# Patient Record
Sex: Female | Born: 1935
Health system: Southern US, Community
[De-identification: ages and names within clinical notes are randomized; demographics above are authoritative.]

## PROBLEM LIST (undated history)

## (undated) DIAGNOSIS — C189 Malignant neoplasm of colon, unspecified: Secondary | ICD-10-CM

## (undated) DIAGNOSIS — R011 Cardiac murmur, unspecified: Secondary | ICD-10-CM

## (undated) DIAGNOSIS — G250 Essential tremor: Secondary | ICD-10-CM

## (undated) DIAGNOSIS — I350 Nonrheumatic aortic (valve) stenosis: Secondary | ICD-10-CM

## (undated) DIAGNOSIS — J45909 Unspecified asthma, uncomplicated: Secondary | ICD-10-CM

## (undated) DIAGNOSIS — F329 Major depressive disorder, single episode, unspecified: Secondary | ICD-10-CM

## (undated) DIAGNOSIS — I10 Essential (primary) hypertension: Secondary | ICD-10-CM

## (undated) HISTORY — DX: Nonrheumatic aortic (valve) stenosis: I35.0

## (undated) HISTORY — DX: Malignant neoplasm of colon, unspecified: C18.9

## (undated) HISTORY — PX: COLOSTOMY: SHX63

## (undated) HISTORY — DX: Cardiac murmur, unspecified: R01.1

## (undated) HISTORY — DX: Essential tremor: G25.0

## (undated) HISTORY — DX: Essential (primary) hypertension: I10

## (undated) HISTORY — DX: Major depressive disorder, single episode, unspecified: F32.9

---

## 2006-05-24 ENCOUNTER — Ambulatory Visit: Payer: Self-pay | Admitting: Family Medicine

## 2006-06-24 ENCOUNTER — Ambulatory Visit: Payer: Self-pay | Admitting: Family Medicine

## 2006-08-13 ENCOUNTER — Ambulatory Visit: Payer: Self-pay | Admitting: Family Medicine

## 2006-08-26 ENCOUNTER — Ambulatory Visit: Payer: Self-pay | Admitting: Family Medicine

## 2006-10-14 ENCOUNTER — Ambulatory Visit: Payer: Self-pay | Admitting: Family Medicine

## 2006-10-16 ENCOUNTER — Ambulatory Visit: Payer: Self-pay | Admitting: Family Medicine

## 2006-11-07 ENCOUNTER — Ambulatory Visit: Payer: Self-pay | Admitting: Family Medicine

## 2007-01-20 ENCOUNTER — Ambulatory Visit: Payer: Self-pay | Admitting: Family Medicine

## 2007-07-01 ENCOUNTER — Ambulatory Visit: Payer: Self-pay | Admitting: Family Medicine

## 2007-07-01 DIAGNOSIS — I10 Essential (primary) hypertension: Secondary | ICD-10-CM | POA: Insufficient documentation

## 2007-07-01 DIAGNOSIS — J449 Chronic obstructive pulmonary disease, unspecified: Secondary | ICD-10-CM

## 2007-07-01 DIAGNOSIS — F411 Generalized anxiety disorder: Secondary | ICD-10-CM

## 2007-09-04 ENCOUNTER — Ambulatory Visit: Payer: Self-pay | Admitting: Family Medicine

## 2007-11-18 ENCOUNTER — Ambulatory Visit: Payer: Self-pay | Admitting: Family Medicine

## 2008-02-05 ENCOUNTER — Ambulatory Visit: Payer: Self-pay | Admitting: Family Medicine

## 2008-02-06 ENCOUNTER — Encounter: Payer: Self-pay | Admitting: Family Medicine

## 2008-02-09 ENCOUNTER — Encounter: Payer: Self-pay | Admitting: Family Medicine

## 2008-02-09 ENCOUNTER — Telehealth: Payer: Self-pay | Admitting: Family Medicine

## 2008-02-09 LAB — CONVERTED CEMR LAB
ALT: 9 units/L (ref 0–35)
AST: 14 units/L (ref 0–37)
Albumin: 4.3 g/dL (ref 3.5–5.2)
Alkaline Phosphatase: 77 units/L (ref 39–117)
Cholesterol, target level: 200 mg/dL
HDL goal, serum: 40 mg/dL
LDL Cholesterol: 112 mg/dL — ABNORMAL HIGH (ref 0–99)
LDL Goal: 130 mg/dL
Potassium: 5.6 meq/L — ABNORMAL HIGH (ref 3.5–5.3)
Sodium: 139 meq/L (ref 135–145)
TSH: 6.734 microintl units/mL — ABNORMAL HIGH (ref 0.350–5.50)
Total Protein: 7.1 g/dL (ref 6.0–8.3)

## 2008-02-20 ENCOUNTER — Encounter: Payer: Self-pay | Admitting: Family Medicine

## 2008-02-23 LAB — CONVERTED CEMR LAB
Albumin: 4.2 g/dL (ref 3.5–5.2)
Alkaline Phosphatase: 78 units/L (ref 39–117)
BUN: 14 mg/dL (ref 6–23)
Creatinine, Ser: 0.78 mg/dL (ref 0.40–1.20)
Glucose, Bld: 124 mg/dL — ABNORMAL HIGH (ref 70–99)
Potassium: 5 meq/L (ref 3.5–5.3)
Total Bilirubin: 0.5 mg/dL (ref 0.3–1.2)

## 2008-04-05 ENCOUNTER — Ambulatory Visit: Payer: Self-pay | Admitting: Family Medicine

## 2008-04-05 DIAGNOSIS — E348 Other specified endocrine disorders: Secondary | ICD-10-CM | POA: Insufficient documentation

## 2008-04-19 ENCOUNTER — Ambulatory Visit: Payer: Self-pay | Admitting: Family Medicine

## 2008-04-19 DIAGNOSIS — R946 Abnormal results of thyroid function studies: Secondary | ICD-10-CM | POA: Insufficient documentation

## 2008-04-26 ENCOUNTER — Encounter: Payer: Self-pay | Admitting: Family Medicine

## 2008-04-27 DIAGNOSIS — E119 Type 2 diabetes mellitus without complications: Secondary | ICD-10-CM

## 2008-04-27 LAB — CONVERTED CEMR LAB
BUN: 18 mg/dL (ref 6–23)
Chloride: 103 meq/L (ref 96–112)
Potassium: 4.6 meq/L (ref 3.5–5.3)

## 2008-05-27 ENCOUNTER — Ambulatory Visit: Payer: Self-pay | Admitting: Family Medicine

## 2008-08-19 ENCOUNTER — Ambulatory Visit: Payer: Self-pay | Admitting: Family Medicine

## 2009-04-20 ENCOUNTER — Ambulatory Visit: Payer: Self-pay | Admitting: Family Medicine

## 2009-04-20 DIAGNOSIS — M76899 Other specified enthesopathies of unspecified lower limb, excluding foot: Secondary | ICD-10-CM

## 2009-12-06 ENCOUNTER — Telehealth: Payer: Self-pay | Admitting: Family Medicine

## 2010-07-13 ENCOUNTER — Ambulatory Visit: Payer: Self-pay | Admitting: Family

## 2010-07-13 DIAGNOSIS — K921 Melena: Secondary | ICD-10-CM

## 2010-07-13 LAB — CONVERTED CEMR LAB
BUN: 14 mg/dL (ref 6–23)
Basophils Relative: 0 % (ref 0–1)
Calcium: 9.9 mg/dL (ref 8.4–10.5)
Creatinine, Ser: 0.9 mg/dL (ref 0.40–1.20)
Eosinophils Absolute: 0.1 10*3/uL (ref 0.0–0.7)
MCHC: 31.3 g/dL (ref 30.0–36.0)
MCV: 100.8 fL — ABNORMAL HIGH (ref 78.0–100.0)
Monocytes Absolute: 0.7 10*3/uL (ref 0.1–1.0)
Monocytes Relative: 8 % (ref 3–12)
Neutrophils Relative %: 65 % (ref 43–77)
Potassium: 5.3 meq/L (ref 3.5–5.3)
RBC: 3.96 M/uL (ref 3.87–5.11)

## 2010-07-16 ENCOUNTER — Telehealth: Payer: Self-pay | Admitting: Family

## 2010-07-21 ENCOUNTER — Encounter: Payer: Self-pay | Admitting: Family Medicine

## 2010-08-04 ENCOUNTER — Encounter: Payer: Self-pay | Admitting: Family Medicine

## 2010-08-08 ENCOUNTER — Encounter: Payer: Self-pay | Admitting: Family Medicine

## 2010-08-08 ENCOUNTER — Telehealth: Payer: Self-pay | Admitting: Family Medicine

## 2010-08-08 DIAGNOSIS — C189 Malignant neoplasm of colon, unspecified: Secondary | ICD-10-CM | POA: Insufficient documentation

## 2010-08-17 ENCOUNTER — Encounter: Payer: Self-pay | Admitting: Family Medicine

## 2010-09-13 ENCOUNTER — Encounter: Payer: Self-pay | Admitting: Family Medicine

## 2010-09-14 ENCOUNTER — Encounter: Payer: Self-pay | Admitting: Family Medicine

## 2010-09-25 ENCOUNTER — Encounter: Payer: Self-pay | Admitting: Family Medicine

## 2011-01-30 NOTE — Progress Notes (Signed)
  Phone Note Outgoing Call   Summary of Call: Please call patient and let her know that I reviewed her lab work and it is suggestive of a possible B12 deficiency.  I would like for her to return for a B12 and folate level.  Orders in EMR. Initial call taken by: Kathlene November,  July 17, 2010 8:24 AM  Follow-up for Phone Call        informed pt of results and Nandika Stetzer's instructions. Faxed order to lab- pt will go on Friday Follow-up by: Kathlene November,  July 17, 2010 8:25 AM

## 2011-01-30 NOTE — Assessment & Plan Note (Signed)
Summary: Med refill - jr   Vital Signs:  Patient profile:   77 year old female Height:      64 inches Weight:      160 pounds BMI:     27.56 Pulse rate:   93 / minute BP sitting:   143 / 66  (left arm) Cuff size:   regular  Vitals Entered By: Kathlene November (July 13, 2010 3:21 PM) CC: bleeding from rectum- has past history of hemorroids   Primary Care Provider:  Linford Arnold, C  CC:  bleeding from rectum- has past history of hemorroids.  History of Present Illness: Meredith Wilson is an 77 year old female who presents with 2-3 week history of bleeding from the rectum.   She notes that she was using a "salve" which helped initially.  She has noted bright red blood in the toilet about 2-3 times total.  Notes + rectal pain "outside" which she attributes to her hemorrhoids.  She reports that she has never had a colonoscopy.  Current Medications (verified): 1)  Advair Hfa 115-21 Mcg/act  Aero (Fluticasone-Salmeterol) .... 2 Puffs Inhaled Two Times A Day 2)  Alprazolam 0.5 Mg Tabs (Alprazolam) .... Take One Tablet By Mouth Three Times A Day 3)  Proair Hfa  Aers (Albuterol Sulfate Aers) .... Use One Puff When Going Outside As Needed 4)  Lisinopril-Hydrochlorothiazide 10-12.5 Mg  Tabs (Lisinopril-Hydrochlorothiazide) .... Take 1 Tablet By Mouth Once A Day  Allergies (verified): 1)  ! Pcn 2)  ! * Eggs  Comments:  Nurse/Medical Assistant: The patient's medications and allergies were reviewed with the patient and were updated in the Medication and Allergy Lists. Kathlene November (July 13, 2010 3:22 PM)  Physical Exam  General:  Well-developed,well-nourished,in no acute distress; alert,appropriate and cooperative throughout examination Abdomen:  Bowel sounds positive,abdomen soft and non-tender without masses, organomegaly or hernias noted. Rectal:  Heme +, rectal exam with some external hemorrhoids noted, no internal abnormalities appreciated.    Impression & Recommendations:  Problem # 1:   HEMATOCHEZIA (ICD-578.1) Check CBC to rule out anemia. heme positive today, ? internal hemorrhoids.    Will refer to GI.  Orders: T-CBC w/Diff ([id number]) Gastroenterology Referral (GI)  Complete Medication List: 1)  Advair Hfa 115-21 Mcg/act Aero (Fluticasone-salmeterol) .... 2 puffs inhaled two times a day 2)  Alprazolam 0.5 Mg Tabs (Alprazolam) .... Take one tablet by mouth three times a day 3)  Proair Hfa Aers (Albuterol sulfate aers) .... Use one puff when going outside as needed 4)  Lisinopril-hydrochlorothiazide 10-12.5 Mg Tabs (Lisinopril-hydrochlorothiazide) .... Take 1 tablet by mouth once a day  Other Orders: T-Basic Metabolic Panel [id number]) Administration Flu vaccine - MCR [phone])  Patient Instructions: 1)  Please complete your blood work downstairs today. 2)  You will be contacted about your referral to GI. 3)  Call if you develop recurrent rectal bleeding, go to the ER if severe.    Prescriptions: ADVAIR HFA 115-21 MCG/ACT  AERO (FLUTICASONE-SALMETEROL) 2 puffs inhaled two times a day  #2 x 0   Entered and Authorized by:   Lemont Fillers FNP   Signed by:   Lemont Fillers FNP on 07/13/2010   Method used:   Samples Given   RxID:   [id number] ALPRAZOLAM 0.5 MG TABS (ALPRAZOLAM) take one tablet by mouth three times a day  #90 x 0   Entered and Authorized by:   Lemont Fillers FNP   Signed by:   Lemont Fillers FNP on 07/13/2010  Method used:   Print then Give to Patient   RxID:   [id number] LISINOPRIL-HYDROCHLOROTHIAZIDE 10-12.5 MG  TABS (LISINOPRIL-HYDROCHLOROTHIAZIDE) Take 1 tablet by mouth once a day  #30 Tablet x 2   Entered and Authorized by:   Lemont Fillers FNP   Signed by:   Lemont Fillers FNP on 07/13/2010   Method used:   Electronically to        [facility] [phone]* (retail)       [address]       [place], [place]  [address]       Ph: [phone] or [phone]       Fax: [phone]   RxID:    [id number]

## 2011-01-30 NOTE — Letter (Signed)
Summary: [facility]   Imported By: Lanelle Bal 09/14/2010 11:35:23  _____________________________________________________________________  External Attachment:    Type:   Image     Comment:   External Document

## 2011-01-30 NOTE — Consult Note (Signed)
Summary: [facility]   Imported By: Lanelle Bal 08/15/2010 11:50:50  _____________________________________________________________________  External Attachment:    Type:   Image     Comment:   External Document

## 2011-01-30 NOTE — Consult Note (Signed)
Summary: [facility]  [facility]   Imported By: Lanelle Bal 07/31/2010 11:38:33  _____________________________________________________________________  External Attachment:    Type:   Image     Comment:   External Document

## 2011-01-30 NOTE — Letter (Signed)
Summary: [facility]   Imported By: Lanelle Bal 10/18/2010 09:14:00  _____________________________________________________________________  External Attachment:    Type:   Image     Comment:   External Document

## 2011-01-30 NOTE — Op Note (Signed)
Summary: Rectal Cancer Surgery/Medical [facility]  Rectal Cancer Surgery/Medical [facility]   Imported By: Lanelle Bal 10/02/2010 14:24:23  _____________________________________________________________________  External Attachment:    Type:   Image     Comment:   External Document

## 2011-01-30 NOTE — Op Note (Signed)
Summary: Abdominoperineal Resection/Medical [facility]  Abdominoperineal Resection/Medical [facility]   Imported By: Lanelle Bal 09/26/2010 12:02:22  _____________________________________________________________________  External Attachment:    Type:   Image     Comment:   External Document

## 2011-01-30 NOTE — Letter (Signed)
Summary: Letter to Patient with CT & Lab Results/[facility]  Letter to Patient with CT & Lab Results/[facility]   Imported By: Lanelle Bal 08/22/2010 10:09:31  _____________________________________________________________________  External Attachment:    Type:   Image     Comment:   External Document

## 2011-01-30 NOTE — Progress Notes (Signed)
Summary: Pt dx with colon Ca  Phone Note Outgoing Call   Summary of Call: Called pt to check on her. Dx iwth colon Ca and saw Dr. Jason Fila last week to discuss. She is awaiting  further workup and she is feeling tired today. Told her she is in our thoughts and to call if needs anything.  Initial call taken by: Nani Gasser MD,  August 08, 2010 11:35 AM  New Problems: ADENOCARCINOMA, COLON (ICD-153.9)   New Problems: ADENOCARCINOMA, COLON (ICD-153.9)

## 2011-04-09 ENCOUNTER — Other Ambulatory Visit: Payer: Self-pay | Admitting: Family Medicine

## 2011-04-09 MED ORDER — ALPRAZOLAM 0.5 MG PO TABS: 0.5000 mg | ORAL_TABLET | Freq: Three times a day (TID) | ORAL | Status: AC | PRN

## 2011-05-01 ENCOUNTER — Other Ambulatory Visit: Payer: Self-pay | Admitting: *Deleted

## 2011-05-01 ENCOUNTER — Other Ambulatory Visit: Payer: Self-pay | Admitting: Family

## 2011-05-01 MED ORDER — LISINOPRIL-HYDROCHLOROTHIAZIDE 10-12.5 MG PO TABS: 1.0000 | ORAL_TABLET | Freq: Every day | ORAL | Status: DC

## 2011-05-01 NOTE — Telephone Encounter (Signed)
Received fax request for refill on Lisinopril-hctz 10-12.5mg   1 tablet daily. Last filled 03/30/11. Pt has no future appointments on file. She was last seen 07/13/10. Please advise if ok to give refill?

## 2011-06-21 ENCOUNTER — Other Ambulatory Visit: Payer: Self-pay | Admitting: Family Medicine

## 2011-06-21 MED ORDER — ALPRAZOLAM 0.5 MG PO TABS: 0.5000 mg | ORAL_TABLET | Freq: Three times a day (TID) | ORAL | Status: AC | PRN

## 2011-06-28 ENCOUNTER — Telehealth: Payer: Self-pay | Admitting: Family Medicine

## 2011-06-28 NOTE — Telephone Encounter (Signed)
A call was received for request of advair.  Don't see it on the med list in either system.  Called the patient and patient and the daughter state Dr. Linford Arnold has been giving samples of this medication and she has never had a script that went through the pharm.  Can you please advise if we can give a new script for this medicine that she takes for her asthma?? Jarvis Newcomer, LPN [facility]

## 2011-06-29 ENCOUNTER — Telehealth: Payer: Self-pay | Admitting: Family Medicine

## 2011-06-29 MED ORDER — FLUTICASONE-SALMETEROL 115-21 MCG/ACT IN AERO: 2.0000 | INHALATION_SPRAY | Freq: Two times a day (BID) | RESPIRATORY_TRACT | Status: DC

## 2011-06-29 NOTE — Telephone Encounter (Signed)
Pt contacted, but N/A after several rings to let her know that the prescription had been sent to her pharm and she could pick it up.   Jarvis Newcomer, LPN [facility]

## 2011-06-29 NOTE — Telephone Encounter (Signed)
I found her old dose and RFd it for her today.  She is overdue for f/u visit with Dr Linford Arnold.  Pls try to set this up in the next 3 wks.

## 2011-07-02 NOTE — Telephone Encounter (Signed)
Closed

## 2011-07-04 ENCOUNTER — Other Ambulatory Visit: Payer: Self-pay | Admitting: Family Medicine

## 2011-08-29 ENCOUNTER — Other Ambulatory Visit: Payer: Self-pay | Admitting: Family Medicine

## 2011-08-29 ENCOUNTER — Encounter: Payer: Self-pay | Admitting: Family Medicine

## 2011-08-29 NOTE — Telephone Encounter (Signed)
Daughter called and said that her mother needed refill of her xanex, but back in June 2012 Dr. Cathey Endow reviewed request for MDI and said then that pt needed to follow up within 3 weeks.  Pt did not come for appt.   Plan:  Pt daughter informed will need to schedule office visit for multi problems(DM, anxiety, thyroid, HTN).  Allotted 30 min appt since multi problems.  Scheduled for 09-04-11. Jarvis Newcomer, LPN [facility]

## 2011-08-30 ENCOUNTER — Encounter: Payer: Self-pay | Admitting: Family Medicine

## 2011-08-31 ENCOUNTER — Other Ambulatory Visit: Payer: Self-pay | Admitting: *Deleted

## 2011-08-31 MED ORDER — ALPRAZOLAM 0.5 MG PO TABS: 0.5000 mg | ORAL_TABLET | Freq: Three times a day (TID) | ORAL | Status: DC | PRN

## 2011-09-01 ENCOUNTER — Other Ambulatory Visit: Payer: Self-pay | Admitting: Family Medicine

## 2011-09-04 ENCOUNTER — Encounter: Payer: Self-pay | Admitting: Family Medicine

## 2011-09-04 ENCOUNTER — Telehealth: Payer: Self-pay | Admitting: Family Medicine

## 2011-09-04 ENCOUNTER — Ambulatory Visit (INDEPENDENT_AMBULATORY_CARE_PROVIDER_SITE_OTHER): Payer: 59 | Admitting: Family Medicine

## 2011-09-04 DIAGNOSIS — F329 Major depressive disorder, single episode, unspecified: Secondary | ICD-10-CM | POA: Insufficient documentation

## 2011-09-04 DIAGNOSIS — C189 Malignant neoplasm of colon, unspecified: Secondary | ICD-10-CM

## 2011-09-04 DIAGNOSIS — F32A Depression, unspecified: Secondary | ICD-10-CM

## 2011-09-04 DIAGNOSIS — Z85038 Personal history of other malignant neoplasm of large intestine: Secondary | ICD-10-CM | POA: Insufficient documentation

## 2011-09-04 HISTORY — DX: Malignant neoplasm of colon, unspecified: C18.9

## 2011-09-04 HISTORY — DX: Depression, unspecified: F32.A

## 2011-09-04 MED ORDER — FLUOXETINE HCL 20 MG PO CAPS: 20.0000 mg | ORAL_CAPSULE | Freq: Every day | ORAL | Status: DC

## 2011-09-04 MED ORDER — ALPRAZOLAM 0.5 MG PO TABS: 0.5000 mg | ORAL_TABLET | Freq: Three times a day (TID) | ORAL | Status: DC | PRN

## 2011-09-04 NOTE — Progress Notes (Signed)
  Subjective:    Patient ID: Meredith Wilson, female    DOB: [birth year], 77 y.o.   MRN: [id number]  HPI She was dx with colon cancer. She now has a bag and she was told she is not a candidate for chemo/raditatikon.  See. Dr. Owens Loffler and Dr. Toni Arthurs at [facility].  [phone].  She is really struggling financially and with her medical bills.  She had labs done about 3 weeks ago which she reports were normal. I personally haven't gotten a copy of any of the records from her recent hospitalization and surgery..   Had an asthma attack recently. She says since then she's been doing well. She does have a history of chronic bronchitis. She said she just started having a coughing fit and feel like she can breathe but it finally, resolved. She's not had any recent infections or fevers. No shortness of breath. She is here today because she says she is really struggling with her mood. She's been very careful and has felt very down since her diagnosis of cancer. Unfortunately she has not candidate for radiation or chemotherapy. Her family is supportive of her. She does currently take Xanax but wants to do something else that she could use. She does not want to increase the dose on her Xanax.  Review of Systems     Objective:   Physical Exam  Constitutional: She is oriented to person, place, and time. She appears well-developed and well-nourished.  HENT:  Head: Normocephalic and atraumatic.  Neck: No thyromegaly present.  Cardiovascular: Normal rate, regular rhythm and normal heart sounds.   Pulmonary/Chest: Effort normal and breath sounds normal.  Lymphadenopathy:    She has no cervical adenopathy.  Neurological: She is alert and oriented to person, place, and time.  Skin: Skin is warm and dry.  Psychiatric: She has a normal mood and affect. Her behavior is normal.          Assessment & Plan:  Anxiety/depression - GAD-8 is 20 today. We discussed different options. I recommend starting an  SSRI such as fluoxetine. I did discuss the potential side effects that typically resolve in the first 1-2 weeks. She will followup in 3-4 weeks. I also refilled her Xanax as well. Fortunately she does have a supportive family. She would also be a good candidate for counseling she is dealing with her cancer.  Episode of shortness of breath. It is unclear to me if this was truly asthma or if she was having more of a panic attack. Certainly she feels well today and her lung exam is normal.  She declined the flu vaccine today.

## 2011-09-04 NOTE — Telephone Encounter (Signed)
Please call and get records about patient's recent colon cancer.Dr. Owens Loffler and Dr. Toni Arthurs at [facility]. [phone].

## 2011-09-06 NOTE — Telephone Encounter (Signed)
Called and requested records be faxed to [place]. KJ LPN

## 2011-09-25 ENCOUNTER — Encounter: Payer: Self-pay | Admitting: Family Medicine

## 2011-10-02 ENCOUNTER — Ambulatory Visit (INDEPENDENT_AMBULATORY_CARE_PROVIDER_SITE_OTHER): Payer: 59 | Admitting: Family Medicine

## 2011-10-02 ENCOUNTER — Encounter: Payer: Self-pay | Admitting: Family Medicine

## 2011-10-02 VITALS — BP 151/73 | HR 80 | Wt 169.0 lb

## 2011-10-02 DIAGNOSIS — M7060 Trochanteric bursitis, unspecified hip: Secondary | ICD-10-CM

## 2011-10-02 DIAGNOSIS — F411 Generalized anxiety disorder: Secondary | ICD-10-CM

## 2011-10-02 DIAGNOSIS — M76899 Other specified enthesopathies of unspecified lower limb, excluding foot: Secondary | ICD-10-CM

## 2011-10-02 DIAGNOSIS — I10 Essential (primary) hypertension: Secondary | ICD-10-CM

## 2011-10-02 DIAGNOSIS — F419 Anxiety disorder, unspecified: Secondary | ICD-10-CM

## 2011-10-02 MED ORDER — FLUOXETINE HCL 40 MG PO CAPS: 40.0000 mg | ORAL_CAPSULE | Freq: Every day | ORAL | Status: DC

## 2011-10-02 NOTE — Progress Notes (Signed)
  Subjective:    Patient ID: Meredith Wilson, female    DOB: 1923/03/17, 77 y.o.   MRN: [id number]  HPI Left hip pain for about 5 days ago.  Now pianful to walk. Painful to stand from sitting position. Has has problems with it on and off.  Last injection was 1.5 ago for trochanteric bursitis and says it helped her up until recently. No other alleviating symptoms. She has not been using anything for pain relief.  Mood - tolerateing fluoxetine well w/ no SE. Feels it has helped her some. She denies any side effects on the medication. She would like to increase her dose.  Hypertension-she says she is very consistent with her medications and takes them regularly. Though she did not take them this morning like she usually does. No chest pain or shortness of breath. Review of Systems     Objective:   Physical Exam  Constitutional: She is oriented to person, place, and time. She appears well-developed and well-nourished.  HENT:  Head: Normocephalic and atraumatic.  Cardiovascular: Normal rate, regular rhythm and normal heart sounds.   Pulmonary/Chest: Effort normal and breath sounds normal.  Musculoskeletal:       Tender over the left greater trochanteric bursa. Hip with NROM. Painful with walking.   Neurological: She is alert and oriented to person, place, and time.       + tremor, essential  Skin: Skin is warm and dry.  Psychiatric: She has a normal mood and affect.          Assessment & Plan:  Anxiety - GAD-7 score of 16 (down from score of 20). PHQ- 9 score of 7. Will bump dose dose on the fluoexetine.  Recheck in 6 weeks.   Lt trochanteric bursitis - Dsicussed options. She wanted to go ahead with injection today.   HTN - didn't take her med today. Reminded her to take med and will recheck in 6 weeks.    Trochanteric bursa injection-there was cleaned with iodine and alcohol. 9-cc of 1% lidocaine without epi and 40 mg of Kenalog were injected into the left great greater trochanteric  bursa. Patient tolerated the procedure well and had 0 blood loss.

## 2011-11-04 ENCOUNTER — Other Ambulatory Visit: Payer: Self-pay | Admitting: Family Medicine

## 2011-11-13 ENCOUNTER — Ambulatory Visit: Payer: 59 | Admitting: Family Medicine

## 2011-12-26 ENCOUNTER — Other Ambulatory Visit: Payer: Self-pay | Admitting: *Deleted

## 2011-12-26 MED ORDER — ALPRAZOLAM 0.5 MG PO TABS: 0.5000 mg | ORAL_TABLET | Freq: Three times a day (TID) | ORAL | Status: DC | PRN

## 2012-01-28 ENCOUNTER — Other Ambulatory Visit: Payer: Self-pay | Admitting: Family Medicine

## 2012-02-28 ENCOUNTER — Other Ambulatory Visit: Payer: Self-pay | Admitting: *Deleted

## 2012-02-28 MED ORDER — LISINOPRIL-HYDROCHLOROTHIAZIDE 10-12.5 MG PO TABS: 1.0000 | ORAL_TABLET | Freq: Every day | ORAL | Status: DC

## 2012-03-05 ENCOUNTER — Other Ambulatory Visit: Payer: Self-pay | Admitting: *Deleted

## 2012-03-05 MED ORDER — ALPRAZOLAM 0.5 MG PO TABS
0.5000 mg | ORAL_TABLET | Freq: Three times a day (TID) | ORAL | Status: DC | PRN
Start: 2012-03-05 — End: 2012-07-28

## 2012-03-31 ENCOUNTER — Other Ambulatory Visit: Payer: Self-pay | Admitting: Family Medicine

## 2012-05-09 ENCOUNTER — Other Ambulatory Visit: Payer: Self-pay | Admitting: Family Medicine

## 2012-06-13 ENCOUNTER — Ambulatory Visit
Admission: RE | Admit: 2012-06-13 | Discharge: 2012-06-13 | Disposition: A | Discharge status: Home / Self Care | Payer: 59 | Source: Ambulatory Visit | Attending: Family Medicine | Admitting: Family Medicine

## 2012-06-13 ENCOUNTER — Encounter: Payer: Self-pay | Admitting: Family Medicine

## 2012-06-13 ENCOUNTER — Ambulatory Visit (INDEPENDENT_AMBULATORY_CARE_PROVIDER_SITE_OTHER): Payer: 59 | Admitting: Family Medicine

## 2012-06-13 VITALS — BP 123/56 | HR 79 | Temp 98.0°F | Wt 175.0 lb

## 2012-06-13 DIAGNOSIS — R0781 Pleurodynia: Secondary | ICD-10-CM

## 2012-06-13 DIAGNOSIS — H547 Unspecified visual loss: Secondary | ICD-10-CM

## 2012-06-13 DIAGNOSIS — R1011 Right upper quadrant pain: Secondary | ICD-10-CM

## 2012-06-13 DIAGNOSIS — Z9181 History of falling: Secondary | ICD-10-CM

## 2012-06-13 DIAGNOSIS — W19XXXA Unspecified fall, initial encounter: Secondary | ICD-10-CM

## 2012-06-13 DIAGNOSIS — R079 Chest pain, unspecified: Secondary | ICD-10-CM

## 2012-06-13 MED ORDER — HYDROCODONE-ACETAMINOPHEN 5-325 MG PO TABS: 2.0000 | ORAL_TABLET | Freq: Four times a day (QID) | ORAL | Status: DC | PRN

## 2012-06-13 MED ORDER — ONDANSETRON 4 MG PO TBDP: 4.0000 mg | ORAL_TABLET | Freq: Three times a day (TID) | ORAL | Status: DC | PRN

## 2012-06-13 MED ORDER — LORATADINE 10 MG PO TABS: 10.0000 mg | ORAL_TABLET | Freq: Every day | ORAL | Status: DC

## 2012-06-13 NOTE — Patient Instructions (Addendum)
We will call you with your lab results. If you don't here from us in about a week then please give us a call at [phone].  

## 2012-06-13 NOTE — Progress Notes (Signed)
  Subjective:    Patient ID: Meredith Wilson, female    DOB: [birth year], 78 y.o.   MRN: [id number]  HPI Says doesn' see well and tried to see down on a chair and missed and landed on her bottom and hit her ribs on the chair.  Has been using Aleve but not really helping. No SOB. Radiates to fron of ribs.  Daugher will be staying with her.   RUQ pain for about 6 months. Now her belly feels swollen.  Feels nauseated all the time. Pain is worse after she eating.  Hx of Gb removal.  Last saw oncology 6 months ago.  No changein bowels. Most of the time feels constipated.    Review of Systems     Objective:   Physical Exam  Constitutional: She is oriented to person, place, and time. She appears well-developed and well-nourished.  HENT:  Head: Normocephalic and atraumatic.  Cardiovascular: Normal rate, regular rhythm and normal heart sounds.   Pulmonary/Chest: Effort normal and breath sounds normal.  Abdominal: Soft. Bowel sounds are normal. She exhibits no distension and no mass. There is tenderness. There is no rebound and no guarding.       Very tender in the RUQ.  + rebound.   Musculoskeletal:       She is also tender over the right lateral lower ribs. No bruising or contusion or swelling.  Neurological: She is alert and oriented to person, place, and time.  Skin: Skin is warm and dry.  Psychiatric: She has a normal mood and affect. Her behavior is normal.          Assessment & Plan:  Dec vision - Needs to get eye appint. Has been years since last check-up.  She did have her cataract in one eye treated but never had the other one fixed. She says she has been under the grocery store because she can't see the items on the shelves anymore.  High fall risk - work on getting vision exam.  She doesn't use any assisstive devices.   RUQ pain - May be liver vs scar tissue for recurrence of colon cancer.  She has followup with her oncologist in 2 weeks. It is her six-month checkup. We will go  ahead and at least check liver enzymes and a CBC today. This is concerning to me. If her appointment gets pushed back with her oncologist her to call me immediately so that we can move forward with a CT scan or at least an ultrasound at a minimum. I also sent a prescription for Zofran for her nausea. Her daughter is here with her today and agrees. She does have a history of colon cancer with a colostomy bag.  Rib pain - Will get rib films to evaluate for fracture. Given a prescription for hydrocodone. I did let her know that this could increase her risk of falls per daughter says that she will stay with her and make sure that she is safe. I did warn about constipation with narcotics and encouraged her to take a stool softener with the medication.

## 2012-06-14 LAB — COMPLETE METABOLIC PANEL WITH GFR
Albumin: 4.3 g/dL (ref 3.5–5.2)
BUN: 15 mg/dL (ref 6–23)
CO2: 24 mEq/L (ref 19–32)
GFR, Est African American: 64 mL/min
GFR, Est Non African American: 55 mL/min — ABNORMAL LOW
Glucose, Bld: 126 mg/dL — ABNORMAL HIGH (ref 70–99)
Sodium: 129 mEq/L — ABNORMAL LOW (ref 135–145)
Total Bilirubin: 0.5 mg/dL (ref 0.3–1.2)
Total Protein: 6.9 g/dL (ref 6.0–8.3)

## 2012-06-14 LAB — CBC WITH DIFFERENTIAL/PLATELET

## 2012-06-16 ENCOUNTER — Telehealth: Payer: Self-pay | Admitting: *Deleted

## 2012-06-16 DIAGNOSIS — R1011 Right upper quadrant pain: Secondary | ICD-10-CM

## 2012-06-16 NOTE — Telephone Encounter (Signed)
Lab order

## 2012-06-23 ENCOUNTER — Other Ambulatory Visit: Payer: Self-pay | Admitting: *Deleted

## 2012-06-23 MED ORDER — HYDROCODONE-ACETAMINOPHEN 5-325 MG PO TABS: 2.0000 | ORAL_TABLET | Freq: Four times a day (QID) | ORAL | Status: DC | PRN

## 2012-06-23 NOTE — Telephone Encounter (Signed)
I will refill once. Dr. Linford Arnold will manage next refill. Only take as needed for pain.

## 2012-06-23 NOTE — Telephone Encounter (Signed)
Pt's daughter is calling requesting a refill on her mother's norco. Please advise if we can refill. She got 20 on 06/13/12.

## 2012-06-24 ENCOUNTER — Other Ambulatory Visit: Payer: Self-pay | Admitting: *Deleted

## 2012-06-24 ENCOUNTER — Other Ambulatory Visit: Payer: Self-pay | Admitting: Physician Assistant

## 2012-06-24 MED ORDER — HYDROCODONE-ACETAMINOPHEN 5-325 MG PO TABS: 2.0000 | ORAL_TABLET | Freq: Four times a day (QID) | ORAL | Status: AC | PRN

## 2012-06-24 MED ORDER — HYDROCODONE-ACETAMINOPHEN 5-325 MG PO TABS: 2.0000 | ORAL_TABLET | Freq: Four times a day (QID) | ORAL | Status: DC | PRN

## 2012-06-24 NOTE — Telephone Encounter (Signed)
Called and unable to LM

## 2012-06-24 NOTE — Telephone Encounter (Signed)
[facility] to [facility]

## 2012-07-28 ENCOUNTER — Other Ambulatory Visit: Payer: Self-pay | Admitting: Family Medicine

## 2012-07-28 ENCOUNTER — Other Ambulatory Visit: Payer: Self-pay | Admitting: *Deleted

## 2012-07-28 MED ORDER — ALPRAZOLAM 0.5 MG PO TABS: 0.5000 mg | ORAL_TABLET | Freq: Three times a day (TID) | ORAL | Status: DC | PRN

## 2012-09-30 ENCOUNTER — Other Ambulatory Visit: Payer: Self-pay | Admitting: Family Medicine

## 2012-12-01 ENCOUNTER — Other Ambulatory Visit: Payer: Self-pay | Admitting: Family Medicine

## 2012-12-28 ENCOUNTER — Other Ambulatory Visit: Payer: Self-pay | Admitting: Family Medicine

## 2013-02-02 ENCOUNTER — Other Ambulatory Visit: Payer: Self-pay | Admitting: Family Medicine

## 2013-02-02 NOTE — Telephone Encounter (Signed)
Patient hasn't been seen since June 2013

## 2013-02-03 NOTE — Telephone Encounter (Signed)
Ok to fill x 1 but does need a 30 min f/u visit schedule since hasn't been seen in over 6 mo.

## 2013-02-04 ENCOUNTER — Other Ambulatory Visit: Payer: Self-pay

## 2013-02-04 MED ORDER — ALPRAZOLAM 0.5 MG PO TABS: 0.5000 mg | ORAL_TABLET | Freq: Three times a day (TID) | ORAL | Status: DC

## 2013-05-26 ENCOUNTER — Other Ambulatory Visit: Payer: Self-pay | Admitting: Family Medicine

## 2013-05-28 ENCOUNTER — Other Ambulatory Visit: Payer: Self-pay | Admitting: Family Medicine

## 2013-07-28 ENCOUNTER — Other Ambulatory Visit: Payer: Self-pay | Admitting: Family Medicine

## 2013-07-30 ENCOUNTER — Other Ambulatory Visit: Payer: Self-pay | Admitting: Family Medicine

## 2013-08-02 ENCOUNTER — Other Ambulatory Visit: Payer: Self-pay | Admitting: Family Medicine

## 2013-08-03 ENCOUNTER — Telehealth: Payer: Self-pay | Admitting: *Deleted

## 2013-08-03 NOTE — Telephone Encounter (Signed)
Ins sent a fax stating that the zofran will only be covered if pt is actively receiving chemo or radiation & she is not at this time.  Spoke with pt & asked her if she would like [place] to send her something different in & she told me to just forget about the rx altogether.

## 2013-08-06 ENCOUNTER — Encounter: Payer: Self-pay | Admitting: Family Medicine

## 2013-08-06 ENCOUNTER — Ambulatory Visit (INDEPENDENT_AMBULATORY_CARE_PROVIDER_SITE_OTHER): Payer: 59 | Admitting: Family Medicine

## 2013-08-06 VITALS — BP 141/76 | HR 92 | Wt 171.0 lb

## 2013-08-06 DIAGNOSIS — F329 Major depressive disorder, single episode, unspecified: Secondary | ICD-10-CM

## 2013-08-06 DIAGNOSIS — C189 Malignant neoplasm of colon, unspecified: Secondary | ICD-10-CM

## 2013-08-06 DIAGNOSIS — I10 Essential (primary) hypertension: Secondary | ICD-10-CM

## 2013-08-06 DIAGNOSIS — Z131 Encounter for screening for diabetes mellitus: Secondary | ICD-10-CM

## 2013-08-06 DIAGNOSIS — R110 Nausea: Secondary | ICD-10-CM

## 2013-08-06 DIAGNOSIS — E348 Other specified endocrine disorders: Secondary | ICD-10-CM

## 2013-08-06 DIAGNOSIS — F32A Depression, unspecified: Secondary | ICD-10-CM

## 2013-08-06 LAB — POCT GLYCOSYLATED HEMOGLOBIN (HGB A1C): Hemoglobin A1C: 5.1

## 2013-08-06 MED ORDER — LISINOPRIL-HYDROCHLOROTHIAZIDE 10-12.5 MG PO TABS: ORAL_TABLET | ORAL | Status: DC

## 2013-08-06 MED ORDER — ALPRAZOLAM 0.5 MG PO TABS: 0.5000 mg | ORAL_TABLET | Freq: Three times a day (TID) | ORAL | Status: DC

## 2013-08-06 MED ORDER — PROMETHAZINE HCL 12.5 MG PO TABS: 12.5000 mg | ORAL_TABLET | Freq: Four times a day (QID) | ORAL | Status: DC | PRN

## 2013-08-06 MED ORDER — CITALOPRAM HYDROBROMIDE 20 MG PO TABS: 20.0000 mg | ORAL_TABLET | Freq: Every day | ORAL | Status: DC

## 2013-08-06 NOTE — Progress Notes (Signed)
  Subjective:    Patient ID: Meredith Wilson, female    DOB: October 31, 1923, 79 y.o.   MRN: [id number]  HPI Depression - she has been really teaful lately. Her daughter is here with her.  She didn't tolerate the prozac well. Says she didn't feel good on it but couldn't tell me any specific S.e.  She is out of her xanax.  Ran out 2 days ago.  Her daughter does well on celexa.    He is having catarct surgery next week.  She had the right done a few weeks ago and that has helped her mood. Her vision was getting so bad she would just burst into tears bc she couldn't see well.   Still struggling with colon cancer but she is stable so far. Her insurance denied her zofran.  She is down 4 lbs.  Has had colostoy bag x 3 yr.   Chronic bronchitis-she's been out of inhaler. She's been using her daughter's occasionally. She has had a couple episodes of feeling short of breath the last month. No acute sxs today. No cough or change in sputum production.   HTN-  Pt denies chest pain, SOB, dizziness, or heart palpitations.  Taking meds as directed w/o problems.  Denies medication side effects.     Review of Systems     Objective:   Physical Exam  Constitutional: She is oriented to person, place, and time. She appears well-developed and well-nourished.  HENT:  Head: Normocephalic and atraumatic.  Cardiovascular: Normal rate, regular rhythm and normal heart sounds.   Pulmonary/Chest: Effort normal and breath sounds normal.  Musculoskeletal: She exhibits no edema.  Neurological: She is alert and oriented to person, place, and time.  Skin: Skin is warm and dry.  Psychiatric: She has a normal mood and affect. Her behavior is normal.          Assessment & Plan:  Depression  - we discussed the option of starting citalopram. Since it is working well for her daughter it may work well for her. She is a thin to trying it. We'll start medication and have her f/u in 6 weeks.   Colon cancer - for her nausea we  will try Phenergan for Zofran is not covered by her insurance. I told her to call me if she feels like it's not working well. She usually gets nauseated around bedtime and takes it then.  Chronic bronchitis - Given sample of Advair.  I know medicaiton is expesive. Can change if something is covered beter on her formulary.   HTN- Well controlled on current regime.   IFG - A1C in the normal range. Will downgrade to normal and removed dx from her problem list.

## 2013-09-29 ENCOUNTER — Other Ambulatory Visit: Payer: Self-pay | Admitting: Family Medicine

## 2013-10-05 ENCOUNTER — Other Ambulatory Visit: Payer: Self-pay | Admitting: Family Medicine

## 2013-11-05 ENCOUNTER — Ambulatory Visit (INDEPENDENT_AMBULATORY_CARE_PROVIDER_SITE_OTHER): Payer: 59 | Admitting: Family Medicine

## 2013-11-30 ENCOUNTER — Other Ambulatory Visit: Payer: Self-pay | Admitting: Family Medicine

## 2013-12-08 ENCOUNTER — Other Ambulatory Visit: Payer: Self-pay | Admitting: *Deleted

## 2013-12-08 MED ORDER — ALPRAZOLAM 0.5 MG PO TABS: ORAL_TABLET | ORAL | Status: DC

## 2014-01-11 DIAGNOSIS — J449 Chronic obstructive pulmonary disease, unspecified: Secondary | ICD-10-CM | POA: Insufficient documentation

## 2014-01-14 ENCOUNTER — Other Ambulatory Visit: Payer: Self-pay | Admitting: Family Medicine

## 2014-01-14 LAB — COMPLETE METABOLIC PANEL WITH GFR
ALT: 23 U/L (ref 7–35)
AST: 33 U/L
Albumin, Serum(Neph): 3.2
Albumin/Glob SerPl: 1.2
Alkaline Phosphatase: 94 U/L
Anion gap: 7
BUN/Creatinine Ratio: 14.5
BUN: 12 mg/dL (ref 4–21)
CO2: 23 mmol/L
Calcium: 8.3 mg/dL
Chloride: 96 mmol/L
Creat: 0.83
GFR calc Af Amer: >60
GFR calc non Af Amer: >60
Globulin: 2.6
Glucose: 109
Potassium: 4.6 mmol/L
Sodium: 126 mmol/L — AB (ref 137–147)
Total Bilirubin: 1.3 mg/dL
Total Protein: 5.8 g/dL

## 2014-01-14 LAB — TSH: TSH: 5.72

## 2014-01-14 LAB — VITAMIN B12: Blood: 8.3

## 2014-01-26 ENCOUNTER — Telehealth: Payer: Self-pay | Admitting: *Deleted

## 2014-01-26 NOTE — Telephone Encounter (Signed)
Pt's daughter informed that her appt is @ 1045 AM tomorrow. She also wanted to know if the copay from her last visit will be applied being that she had to go to the hospital the same day she came in? I told her that I think so however I'm not for sure.Audelia Hives [place]

## 2014-01-27 ENCOUNTER — Telehealth: Payer: Self-pay | Admitting: Family Medicine

## 2014-01-27 ENCOUNTER — Ambulatory Visit (INDEPENDENT_AMBULATORY_CARE_PROVIDER_SITE_OTHER): Payer: 59 | Admitting: Family Medicine

## 2014-01-27 ENCOUNTER — Encounter: Payer: Self-pay | Admitting: Family Medicine

## 2014-01-27 VITALS — BP 120/62 | HR 94 | Wt 162.0 lb

## 2014-01-27 DIAGNOSIS — E871 Hypo-osmolality and hyponatremia: Secondary | ICD-10-CM

## 2014-01-27 DIAGNOSIS — S93401A Sprain of unspecified ligament of right ankle, initial encounter: Secondary | ICD-10-CM

## 2014-01-27 DIAGNOSIS — Z780 Asymptomatic menopausal state: Secondary | ICD-10-CM

## 2014-01-27 DIAGNOSIS — S12500A Unspecified displaced fracture of sixth cervical vertebra, initial encounter for closed fracture: Secondary | ICD-10-CM

## 2014-01-27 DIAGNOSIS — S93409A Sprain of unspecified ligament of unspecified ankle, initial encounter: Secondary | ICD-10-CM

## 2014-01-27 DIAGNOSIS — I359 Nonrheumatic aortic valve disorder, unspecified: Secondary | ICD-10-CM

## 2014-01-27 DIAGNOSIS — Z9181 History of falling: Secondary | ICD-10-CM

## 2014-01-27 DIAGNOSIS — I350 Nonrheumatic aortic (valve) stenosis: Secondary | ICD-10-CM

## 2014-01-27 HISTORY — DX: Nonrheumatic aortic (valve) stenosis: I35.0

## 2014-01-27 MED ORDER — HYDROCODONE-ACETAMINOPHEN 5-325 MG PO TABS: 1.0000 | ORAL_TABLET | ORAL | Status: DC | PRN

## 2014-01-27 MED ORDER — ALPRAZOLAM 0.5 MG PO TABS: ORAL_TABLET | ORAL | Status: DC

## 2014-01-27 NOTE — Progress Notes (Signed)
Subjective:    Patient ID: Meredith Wilson, female    DOB: 12-30-1923, 80 y.o.   MRN: [id number]  HPI She fell backwards about 3 weeks ago. Went to [facility] She had LOC.  Her sodium was low at 120.  She fell twice that day.  Fracture so her C6.  No fracture to ankle,  but did hurt her right ankle.  She is wearing her soft support brace for her neck and has a hard brace at home as well. Given an air-cast for her ankle but says didn't fit well so has been using a wrap support that she feels is comfortable.  Says the swelling in her ankle is much improved compared to 3 weeks ago. Has been able to walk on it som.  They stopped her diuretic and celexa bc thought could have contributed to her fall. Was d/c home yesterday.  Will start getting home PT tomorrow.  She lost weight while there. Has lost 9 lbs since august. She is taking Norco 5/325 every 4 hours for pain right now.    Review of Systems     Objective:   Physical Exam  Constitutional: She is oriented to person, place, and time. She appears well-developed and well-nourished.  HENT:  Head: Normocephalic and atraumatic.  Cardiovascular: Normal rate, regular rhythm and normal heart sounds.   Pulmonary/Chest: Effort normal and breath sounds normal.  Neurological: She is alert and oriented to person, place, and time.  Skin: Skin is warm and dry.  Psychiatric: She has a normal mood and affect. Her behavior is normal.          Assessment & Plan:  Falls - She is doing better. Will start PT this week through home physical therapy. Expect to get her orders for sometime this week to complete. I do think she has gained a lot of strength in doing rehabilitation at a skilled nursing facility. I'm hoping that she will get back to her baseline. Also hopefully getting her sodium levels back under control getting her off of the medications that may have been causing the issue will hopefully R. fall risk as well. We'll also request that home  health company to home evaluation to help her reduce fall risk in the home..   Neck pain - C6 fracture-continue to wear her soft collar for support. She started wearing it for 3 weeks so far. She says one provider told her to wear for 3 weeks another provider to told her to wear for 6-8 weeks. I will review her hospital records and get back with her as far as the specific time frame that she needs to wear the support. Her neck pain has been improving. The she would like a refill on the Norco today. Will wean norco as tolerated.  Given rx today.    Right ankle sprain - still has some swelling around the lateral ankle. A little bit of bruising but appears to be healing well. She has noted increased laxity with anterior-posterior drawer. Normal strength in all directions. Good capillary refill of the toes and dorsal pedal pulses 2+. She does have a wrap support with her today it appears to be in appropriate position. For now I think continuing the support is helpful. I asked her she felt like it was too loose we can always do an ASO lace up brace but she felt like this was enough support in her ankle was continuing to improve. We'll check on this again at her followup in  3-4 weeks.  Hyponatremia-check BMP today. We'll call the results. Hopefully the offending agents have been stopped.

## 2014-01-27 NOTE — Telephone Encounter (Addendum)
Continue to wear soft collar for at least one more week. Try get her in with our partner Dr. Aundria Mems to reevaluate her C6 fracture and make sure that it's healing properly. We will schedule that appointment for her next week. Tonya weekly schedule her. Also make sure that she's taking adequate calcium and vitamin D daily. Also see if she remembers when her last bone density test was. I did not see one in the system.

## 2014-01-27 NOTE — Telephone Encounter (Signed)
Pt's daughter informed appt made for 2.5.15 @215  W/Dr.T. Also told to make sure that she is taking calcium and vit D daily. She does not recall how long ago its been since her bone density.Meredith Wilson, Lahoma Crocker

## 2014-01-28 LAB — BASIC METABOLIC PANEL WITH GFR
BUN: 11 mg/dL (ref 6–23)
CHLORIDE: 102 meq/L (ref 96–112)
CO2: 28 meq/L (ref 19–32)
CREATININE: 0.82 mg/dL (ref 0.50–1.10)
Calcium: 9.5 mg/dL (ref 8.4–10.5)
GFR, EST NON AFRICAN AMERICAN: 64 mL/min
GFR, Est African American: 74 mL/min
Glucose, Bld: 107 mg/dL — ABNORMAL HIGH (ref 70–99)
POTASSIUM: 5.4 meq/L — AB (ref 3.5–5.3)
Sodium: 135 mEq/L (ref 135–145)

## 2014-01-28 NOTE — Telephone Encounter (Signed)
Order for DEXA scan placed. 

## 2014-02-01 ENCOUNTER — Telehealth: Payer: Self-pay | Admitting: *Deleted

## 2014-02-01 NOTE — Telephone Encounter (Signed)
Bonnita Nasuti called and stated that pt does not wish to have a bone dx test done and she was wanting to make Dr.Metheney aware of this.Audelia Hives [place]

## 2014-02-04 ENCOUNTER — Ambulatory Visit: Payer: 59 | Admitting: Sports Medicine

## 2014-02-11 ENCOUNTER — Telehealth: Payer: Self-pay | Admitting: *Deleted

## 2014-02-11 MED ORDER — BUSPIRONE HCL 5 MG PO TABS: 5.0000 mg | ORAL_TABLET | Freq: Three times a day (TID) | ORAL | Status: DC

## 2014-02-11 NOTE — Telephone Encounter (Signed)
Spoke with pharmacy manager & he states that the xanax was filled & dispensed on 2/9 & no identification was asked for.  He states that they could review the video footage of who signed for the med but that would take forever to do.  I then called the pt.  She states to me that her husband did NOT go to the pharm on 2/9 because she didn't have the money to pick it up at that time, she had the money yesterday & that's why he went yesterday to pick it up but it was not there.

## 2014-02-11 NOTE — Telephone Encounter (Signed)
Noted patient with hx multiple falls, anxiety, and rx for controlleds substance that seems to be "lost." I do not feel comfortable rxing this medication as I do think there are better alternatives for chronic/generalized anxiety. It seems she has declined SSRIs in the past and is not on any controller medication at all. Calling in short course of BuSpar now, patient can then f/u with PCP or me re: further anxiety, certainly would recommend SSRI.

## 2014-02-11 NOTE — Telephone Encounter (Signed)
See other phone note for patient and rx disposition.

## 2014-02-11 NOTE — Telephone Encounter (Signed)
Registered nurse Meredith Wilson from [facility] that takes care of Meredith Wilson calls today stating that someone else other than the pt & her husband was sold her xanax rx [facility]. Pt's husband is the ONLY person who ever gets her meds for her, she is unable to do so on her own.  Meredith Wilson went to the pt's house yesterday & pt was in tears & was shaking tremendously.  Pt takes xanax TID for tremors & hasn't had it for the last 2 days.  Meredith Wilson went to the pharm & questioned why no one was asked for an ID & they told her they don't check IDs for xanax.  Would you be willing to send 1 months supply for the pt or even the whole amount.  Pt is going to switch all her meds to [facility] due to this situation.

## 2014-02-12 NOTE — Telephone Encounter (Signed)
Pt notified of new rx.

## 2014-02-22 ENCOUNTER — Other Ambulatory Visit: Payer: Self-pay | Admitting: Family Medicine

## 2014-02-24 ENCOUNTER — Telehealth: Payer: Self-pay | Admitting: *Deleted

## 2014-02-24 NOTE — Telephone Encounter (Signed)
Nurse w/[facility] called and reported that [facility] pulled the video [facility] tapes and reported that pt's daughter was the person who signed for her meds. She stated that pt told her that she (the daughter) is addicted to crack cocaine and that pt has since told the daughter that she cannot come back into the home.  She told the family that she felt like it would be in their best interest to change pharmacies and they are considering [facility] being that they deliver to the home however she is not certain if this is their final decision

## 2014-03-02 ENCOUNTER — Ambulatory Visit: Payer: 59 | Admitting: Family Medicine

## 2014-03-03 ENCOUNTER — Other Ambulatory Visit: Payer: Self-pay | Admitting: Family Medicine

## 2014-03-03 MED ORDER — HYDROCODONE-ACETAMINOPHEN 5-325 MG PO TABS: 1.0000 | ORAL_TABLET | ORAL | Status: DC | PRN

## 2014-03-11 ENCOUNTER — Encounter: Payer: Self-pay | Admitting: Family Medicine

## 2014-03-11 ENCOUNTER — Ambulatory Visit (INDEPENDENT_AMBULATORY_CARE_PROVIDER_SITE_OTHER): Payer: 59 | Admitting: Family Medicine

## 2014-03-11 VITALS — BP 148/70 | HR 89 | Resp 14 | Wt 161.0 lb

## 2014-03-11 DIAGNOSIS — F3289 Other specified depressive episodes: Secondary | ICD-10-CM

## 2014-03-11 DIAGNOSIS — C189 Malignant neoplasm of colon, unspecified: Secondary | ICD-10-CM

## 2014-03-11 DIAGNOSIS — F329 Major depressive disorder, single episode, unspecified: Secondary | ICD-10-CM

## 2014-03-11 DIAGNOSIS — F32A Depression, unspecified: Secondary | ICD-10-CM

## 2014-03-11 DIAGNOSIS — M542 Cervicalgia: Secondary | ICD-10-CM

## 2014-03-11 DIAGNOSIS — E875 Hyperkalemia: Secondary | ICD-10-CM

## 2014-03-11 MED ORDER — ALBUTEROL SULFATE HFA 108 (90 BASE) MCG/ACT IN AERS: 1.0000 | INHALATION_SPRAY | Freq: Four times a day (QID) | RESPIRATORY_TRACT | Status: DC | PRN

## 2014-03-11 MED ORDER — ALPRAZOLAM 0.5 MG PO TABS: ORAL_TABLET | ORAL | Status: DC

## 2014-03-11 NOTE — Progress Notes (Signed)
   Subjective:    Patient ID: Meredith Wilson, female    DOB: [birth year], 80 y.o.   MRN: [id number]  HPI F/U fall.  She is doing better overall.  She has been cooking and has been doing some light housework.  Right ankle is much better.  Occassional swelling.  Neck is some better. She oc gets stiff and tight.  Not using heat or ice. She says she got a refill for the hydrocodone for her husband to pharmacy 3 times to pick it up and they did not have her ready. She is getting some physical therapy. Additionally has one more visit left. She has found it very helpful.  Asthma - has noticed some intermittant wheezing with the weather change. Out of albuterol. Needs a new prescription.  Colon cancer - she's doing well adjusting to having the colostomy bag. Her daughter-in-law helps her with it. She does complain that she gets a little but more full on the left side of her abdomen where the pancreas. She denies any pain or discomfort with it.  Review of Systems     Objective:   Physical Exam  Constitutional: She is oriented to person, place, and time. She appears well-developed and well-nourished.  HENT:  Head: Normocephalic and atraumatic.  Right Ear: External ear normal.  Left Ear: External ear normal.  Nose: Nose normal.     Eyes: Conjunctivae and EOM are normal. Pupils are equal, round, and reactive to light.  Neck: Neck supple. No thyromegaly present.  Cardiovascular: Normal rate, regular rhythm and normal heart sounds.   Pulmonary/Chest: Effort normal and breath sounds normal. She has no wheezes.  Lymphadenopathy:    She has no cervical adenopathy.  Neurological: She is alert and oriented to person, place, and time.  Skin: Skin is warm and dry.  Psychiatric: She has a normal mood and affect.          Assessment & Plan:  Colon cancer - her daughter-in-law is helping her with her colostomy bag and this has been doing a little bit better. She still gets a lot of fullness on the left  side of her abdomen with a bag is. It bothers her somewhat but isn't necessarily painful.  Neck pain-improved but not resolved. She still getting up from a spasm and stiffness. Work on gentle stretching and add heat to her regimen. If it's still not helpful we could consider a muscle relaxer but warned about potential for sedation with this type of medication. I did refill her hydrocodone on the fourth. Unfortunately she did not realize that she had to come pick up the hard copy. Thus we provided that today. She's him is completed physical therapy. I encouraged her to keep up the exercises that they have given her and reviewed with her on her own.  Anxiety-did refill her alprazolam today. Also encouraged to set aside on a refill to make it easier for her.  Asthma-she's been having acute exacerbations. She is out of her albuterol. New prescription sent to pharmacy today. Lungs are clear today.   Elevated potassium level-recheck today.

## 2014-03-12 LAB — BASIC METABOLIC PANEL WITH GFR
BUN: 13 mg/dL (ref 6–23)
CHLORIDE: 104 meq/L (ref 96–112)
CO2: 25 meq/L (ref 19–32)
CREATININE: 0.7 mg/dL (ref 0.50–1.10)
Calcium: 9.2 mg/dL (ref 8.4–10.5)
GFR, Est African American: 89 mL/min
GFR, Est Non African American: 78 mL/min
GLUCOSE: 106 mg/dL — AB (ref 70–99)
Potassium: 4 mEq/L (ref 3.5–5.3)
Sodium: 138 mEq/L (ref 135–145)

## 2014-03-30 ENCOUNTER — Ambulatory Visit (INDEPENDENT_AMBULATORY_CARE_PROVIDER_SITE_OTHER): Payer: 59 | Admitting: Family Medicine

## 2014-03-30 ENCOUNTER — Encounter: Payer: Self-pay | Admitting: Family Medicine

## 2014-03-30 VITALS — BP 176/71 | HR 88 | Temp 97.7°F | Wt 157.0 lb

## 2014-03-30 DIAGNOSIS — J45901 Unspecified asthma with (acute) exacerbation: Secondary | ICD-10-CM

## 2014-03-30 MED ORDER — METHYLPREDNISOLONE SODIUM SUCC 125 MG IJ SOLR
125.0000 mg | Freq: Once | INTRAMUSCULAR | Status: AC
  Administered 2014-03-30: 125 mg via INTRAMUSCULAR

## 2014-03-30 MED ORDER — PREDNISONE 20 MG PO TABS: ORAL_TABLET | ORAL | Status: AC

## 2014-03-30 MED ORDER — IPRATROPIUM-ALBUTEROL 0.5-2.5 (3) MG/3ML IN SOLN
3.0000 mL | RESPIRATORY_TRACT | Status: DC
  Administered 2014-03-30: 3 mL via RESPIRATORY_TRACT

## 2014-03-30 MED ORDER — AZITHROMYCIN 250 MG PO TABS: ORAL_TABLET | ORAL | Status: AC

## 2014-03-30 MED ORDER — IPRATROPIUM-ALBUTEROL 0.5-2.5 (3) MG/3ML IN SOLN
3.0000 mL | RESPIRATORY_TRACT | Status: DC
Start: 2014-03-30 — End: 2014-03-30

## 2014-03-30 NOTE — Progress Notes (Signed)
CC: Meredith Wilson is a 80 y.o. female is here for Asthma   Subjective: HPI:  Complains of shortness of breath and wheezing that has been present for the past week worsening on a daily basis. Seems to be worse in the evenings especially at night. Slight improvement with albuterol user by nebulizer or inhaler.  Continues on Advair twice a day without missed doses. Symptoms are moderate to severe in severity. Denies confusion, fevers, chills, chest pain, back pain, aspiration, choking, nor dizziness  Review Of Systems Outlined In HPI  Past Medical History  Diagnosis Date  . Hypertension     No past surgical history on file. Family History  Problem Relation Age of Onset  . Heart disease Brother     History   Social History  . Marital Status: Widowed    Spouse Name: N/A    Number of Children: N/A  . Years of Education: N/A   Occupational History  . Not on file.   Social History Main Topics  . Smoking status: Never Smoker   . Smokeless tobacco: Not on file  . Alcohol Use: Yes  . Drug Use: No  . Sexual Activity: No     Comment: 1 cffeine per day, walks for exercise, lives with daughter and son-n-law, uses a walker at home.   Other Topics Concern  . Not on file   Social History Narrative  . No narrative on file     Objective: BP 176/71  Pulse 88  Temp(Src) 97.7 F (36.5 C) (Oral)  Wt 157 lb (71.215 kg)  SpO2 95%  General: Alert and Oriented, No Acute Distress HEENT: Pupils equal, round, reactive to light. Conjunctivae clear.  External ears unremarkable, canals clear with intact TMs with appropriate landmarks.  Middle ear appears open without effusion. Pink inferior turbinates.  Moist mucous membranes, pharynx without inflammation nor lesions.  Neck supple without palpable lymphadenopathy nor abnormal masses. Lungs: Comfortable work of breathing moderate end expiratory wheezing in all lung fields slightly improved with nebulizer treatment, before and after there were no  rales/rhonchi/nor signs of consolidation. Cardiac: Regular rate and rhythm. Normal S1/S2.  No murmurs, rubs, nor gallops.   Extremities: No peripheral edema.  Strong peripheral pulses.  Mental Status: No depression, anxiety, nor agitation. Skin: Warm and dry.  Assessment & Plan: Kennie was seen today for asthma.  Diagnoses and associated orders for this visit:  Asthma with acute exacerbation - azithromycin (ZITHROMAX) 250 MG tablet; Take two tabs at once on day 1, then one tab daily on days 2-5. - predniSONE (DELTASONE) 20 MG tablet; Three tabs daily days 1-3, two tabs daily days 4-6, one tab daily days 7-9, half tab daily days 10-13. - methylPREDNISolone sodium succinate (SOLU-MEDROL) 125 mg/2 mL injection 125 mg; Inject 2 mLs (125 mg total) into the muscle once. - Discontinue: ipratropium-albuterol (DUONEB) 0.5-2.5 (3) MG/3ML nebulizer solution 3 mL; Take 3 mLs by nebulization every 4 (four) hours. - ipratropium-albuterol (DUONEB) 0.5-2.5 (3) MG/3ML nebulizer solution 3 mL; Take 3 mLs by nebulization every 4 (four) hours.    Patient reports significant improvement with DuoNeb and feels that the mouth piece of the nebulizer she was using here in clinic was provided better airflow than her piece at home. Scheduled nebulizer treatments at home with albuterol using this new mouth piece every 4 hours for the next 2 days then as needed. Solu-Medrol 125 mg given today along with prednisone taper and azithromycin.Signs and symptoms requring emergent/urgent reevaluation were discussed with the patient.  Return if symptoms worsen or fail to improve.

## 2014-04-01 ENCOUNTER — Ambulatory Visit: Payer: 59 | Admitting: Family Medicine

## 2014-04-06 ENCOUNTER — Telehealth: Payer: Self-pay | Admitting: *Deleted

## 2014-04-06 NOTE — Telephone Encounter (Signed)
Pt called and stated that she did not get her refill of xanax [facility] spoke w/[facility] and she stated that the Rx went thru. Called and informed pt of this.Meredith Wilson

## 2014-04-29 ENCOUNTER — Telehealth: Payer: Self-pay | Admitting: *Deleted

## 2014-04-29 NOTE — Telephone Encounter (Signed)
Pt lvm asking about asthma. Tried to call her and line was busy.Teddy Spike

## 2014-05-03 ENCOUNTER — Ambulatory Visit (INDEPENDENT_AMBULATORY_CARE_PROVIDER_SITE_OTHER): Payer: 59 | Admitting: Family Medicine

## 2014-05-03 ENCOUNTER — Encounter: Payer: Self-pay | Admitting: Family Medicine

## 2014-05-03 VITALS — BP 154/67 | HR 91 | Temp 98.1°F | Wt 145.0 lb

## 2014-05-03 DIAGNOSIS — J45901 Unspecified asthma with (acute) exacerbation: Secondary | ICD-10-CM

## 2014-05-03 MED ORDER — ALBUTEROL SULFATE (2.5 MG/3ML) 0.083% IN NEBU: 2.5000 mg | INHALATION_SOLUTION | Freq: Four times a day (QID) | RESPIRATORY_TRACT | Status: DC | PRN

## 2014-05-03 MED ORDER — MONTELUKAST SODIUM 10 MG PO TABS: 10.0000 mg | ORAL_TABLET | Freq: Every day | ORAL | Status: DC

## 2014-05-03 MED ORDER — METHYLPREDNISOLONE SODIUM SUCC 125 MG IJ SOLR
125.0000 mg | Freq: Once | INTRAMUSCULAR | Status: AC
  Administered 2014-05-03: 125 mg via INTRAMUSCULAR

## 2014-05-03 MED ORDER — PREDNISONE 20 MG PO TABS: ORAL_TABLET | ORAL | Status: AC

## 2014-05-03 NOTE — Addendum Note (Signed)
Addended by: Terance Hart on: 05/03/2014 03:15 PM   Modules accepted: Orders

## 2014-05-03 NOTE — Progress Notes (Signed)
CC: Meredith Wilson is a 80 y.o. female is here for not feeling well   Subjective: HPI:   Accompanied by daughter Meredith Wilson  Patient reports 48 hours of worsening shortness of breath and wheezing with nonproductive cough. Symptoms are typical of her past asthma exacerbations per her report. Symptoms are worse with activity and are resolved fully for about 6 hours after taking albuterol nebulizer treatment. Unfortunate she is out of this medication as of using a treatment just prior to arrival.  Symptoms are worse when pollen counts are high and are worsened by anxiety. He takes Claritin and continues on Advair twice a day. She's using her albuterol 3-4 times a day since symptoms have declined. Overall symptoms are described as moderate in severity at their worst. She denies fevers, chills, productive cough, blood in sputum, chest pain, confusion, nasal congestion, nor facial pain.  Review Of Systems Outlined In HPI  Past Medical History  Diagnosis Date  . Hypertension     No past surgical history on file. Family History  Problem Relation Age of Onset  . Heart disease Brother     History   Social History  . Marital Status: Widowed    Spouse Name: N/A    Number of Children: N/A  . Years of Education: N/A   Occupational History  . Not on file.   Social History Main Topics  . Smoking status: Never Smoker   . Smokeless tobacco: Not on file  . Alcohol Use: Yes  . Drug Use: No  . Sexual Activity: No     Comment: 1 cffeine per day, walks for exercise, lives with daughter and son-n-law, uses a walker at home.   Other Topics Concern  . Not on file   Social History Narrative  . No narrative on file     Objective: BP 154/67  Pulse 91  Temp(Src) 98.1 F (36.7 C) (Oral)  Wt 145 lb (65.772 kg)  General: Alert and Oriented, No Acute Distress HEENT: Pupils equal, round, reactive to light. Conjunctivae clear.  External ears unremarkable, canals clear with intact TMs with appropriate  landmarks.  Middle ear appears open without effusion. Pink inferior turbinates.  Moist mucous membranes, pharynx without inflammation nor lesions.  Neck supple without palpable lymphadenopathy nor abnormal masses. Lungs: Comfortable work of breathing with end expiratory wheezing in all lung fields that is of mild severity. No rhonchi nor rails. Cardiac: Regular rate and rhythm. Normal S1/S2.  No murmurs, rubs, nor gallops.   Extremities: No peripheral edema.  Strong peripheral pulses.  Mental Status: No depression, anxiety, nor agitation. Skin: Warm and dry.  Assessment & Plan: Meredith Wilson was seen today for not feeling well.  Diagnoses and associated orders for this visit:  Asthma with acute exacerbation - albuterol (PROVENTIL) (2.5 MG/3ML) 0.083% nebulizer solution; Take 3 mLs (2.5 mg total) by nebulization every 6 (six) hours as needed for wheezing or shortness of breath. Meredith Wilson may pick up.) - predniSONE (DELTASONE) 20 MG tablet; Three tabs at once daily for five days. Meredith Wilson may pick up) - montelukast (SINGULAIR) 10 MG tablet; Take 1 tablet (10 mg total) by mouth at bedtime.    Asthma exacerbation: Provided refills on albuterol nebulizer solution to be used every 6 hours as needed, consider scheduling this for the next 48 hours then as needed.  Prednisone burst along with Solu-Medrol 125 mg given today. Start Singulair if any lingering symptoms remain.  25 minutes spent face-to-face during visit today of which at least 50%  was counseling or coordinating care regarding: 1. Asthma with acute exacerbation       Return if symptoms worsen or fail to improve.

## 2014-05-27 ENCOUNTER — Ambulatory Visit (INDEPENDENT_AMBULATORY_CARE_PROVIDER_SITE_OTHER): Payer: 59 | Admitting: Family Medicine

## 2014-05-27 ENCOUNTER — Other Ambulatory Visit: Payer: Self-pay | Admitting: Family Medicine

## 2014-05-27 ENCOUNTER — Encounter: Payer: Self-pay | Admitting: Family Medicine

## 2014-05-27 ENCOUNTER — Telehealth: Payer: Self-pay | Admitting: Family Medicine

## 2014-05-27 VITALS — BP 151/73 | HR 97 | Temp 98.1°F

## 2014-05-27 DIAGNOSIS — R197 Diarrhea, unspecified: Secondary | ICD-10-CM

## 2014-05-27 DIAGNOSIS — R112 Nausea with vomiting, unspecified: Secondary | ICD-10-CM

## 2014-05-27 DIAGNOSIS — R300 Dysuria: Secondary | ICD-10-CM

## 2014-05-27 MED ORDER — PROMETHAZINE HCL 25 MG PO TABS: 25.0000 mg | ORAL_TABLET | Freq: Three times a day (TID) | ORAL | Status: DC | PRN

## 2014-05-27 MED ORDER — ONDANSETRON HCL 4 MG PO TABS: 4.0000 mg | ORAL_TABLET | Freq: Three times a day (TID) | ORAL | Status: DC | PRN

## 2014-05-27 NOTE — Telephone Encounter (Signed)
Andrea/Amber, Will you please let patient know that her insurance is causing a hold up with the zofran ([place]) until this goes through i've sent an Rx for promethazine to help with nausea however this will cause some sedation so ultimately I'd like to use zofran if we can get it approved.

## 2014-05-27 NOTE — Telephone Encounter (Signed)
Pt's daughter notified.

## 2014-05-27 NOTE — Progress Notes (Signed)
CC: Meredith Wilson is a 80 y.o. female is here for nausea and vomiting   Subjective: HPI:  Accompanied by daughter-in-law  Patient complains of diarrhea and vomiting that has been present since an acute onset Sunday afternoon. Symptoms were gradually improving without any intervention except for worsening early this morning around 4 AM.  She describes vomiting up to 5 times a day, nonbloody no coffee-ground emesis. Diarrhea is hard to quantify but she notices her colostomy bag has been much more liquid and loose compared to her baseline, she denies any blood or melena appearance in her colostomy bag. Symptoms have been accompanied by abdominal discomfort that is localized in the center of the abdomen, nonradiating, mild in severity, nothing particularly makes better or worse. Review of systems positive for dysuria and decreased appetite since onset of above symptoms. She denies fevers, chills, cough, shortness of breath, back pain, confusion. She continues to have thirst and has been tolerating cranberry juice   Review Of Systems Outlined In HPI  Past Medical History  Diagnosis Date  . Hypertension     No past surgical history on file. Family History  Problem Relation Age of Onset  . Heart disease Brother     History   Social History  . Marital Status: Widowed    Spouse Name: N/A    Number of Children: N/A  . Years of Education: N/A   Occupational History  . Not on file.   Social History Main Topics  . Smoking status: Never Smoker   . Smokeless tobacco: Not on file  . Alcohol Use: Yes  . Drug Use: No  . Sexual Activity: No     Comment: 1 cffeine per day, walks for exercise, lives with daughter and son-n-law, uses a walker at home.   Other Topics Concern  . Not on file   Social History Narrative  . No narrative on file     Objective: BP 151/73  Pulse 97  Temp(Src) 98.1 F (36.7 C) (Oral)  General: Alert and Oriented, No Acute Distress HEENT: Pupils equal, round,  reactive to light. Conjunctivae clear.  Moist mucous membranes parents are remarkable Lungs: Clear to auscultation bilaterally, no wheezing/ronchi/rales.  Comfortable work of breathing. Good air movement. Cardiac: Regular rate and rhythm. Normal S1/S2.  No murmurs, rubs, nor gallops.   Abdomen: Normal bowel sounds, soft and non tender without palpable masses other than mild right upper quadrant pain without rebound or guarding. Colostomy bag without blood or melena, I would describe her stool as loose but not liquid Extremities: No peripheral edema.  Strong peripheral pulses.  Mental Status: No depression, anxiety, nor agitation. Skin: Warm and dry.  Assessment & Plan: Cera was seen today for nausea and vomiting.  Diagnoses and associated orders for this visit:  Nausea with vomiting - Clostridium difficile EIA - COMPLETE METABOLIC PANEL WITH GFR - Lipase - ondansetron (ZOFRAN) 4 MG tablet; Take 1-2 tablets (4-8 mg total) by mouth every 8 (eight) hours as needed for nausea or vomiting.  Diarrhea - Clostridium difficile EIA - COMPLETE METABOLIC PANEL WITH GFR - Lipase  Dysuria - Urinalysis - Urine culture    Nausea and vomiting: Symptomatically control with Zofran, rule out pancreatitis, rule out hepatitis, checking for electrolyte abnormalities, rule out C. Difficile contributing to diarrhea and vomiting. UTI certainly could certainly cause the above symptoms therefore urinalysis and urine culture to be obtained.Signs and symptoms requring emergent/urgent reevaluation were discussed with the patient.  Return if symptoms worsen or fail to  improve.

## 2014-05-28 ENCOUNTER — Telehealth: Payer: Self-pay | Admitting: Family Medicine

## 2014-05-28 ENCOUNTER — Other Ambulatory Visit: Payer: Self-pay | Admitting: Physician Assistant

## 2014-05-28 DIAGNOSIS — A0472 Enterocolitis due to Clostridium difficile, not specified as recurrent: Secondary | ICD-10-CM

## 2014-05-28 DIAGNOSIS — N390 Urinary tract infection, site not specified: Secondary | ICD-10-CM

## 2014-05-28 LAB — URINALYSIS
Glucose, UA: NEGATIVE mg/dL
Hgb urine dipstick: NEGATIVE
KETONES UR: 15 mg/dL — AB
Nitrite: POSITIVE — AB
PROTEIN: NEGATIVE mg/dL
Specific Gravity, Urine: 1.027 (ref 1.005–1.030)
UROBILINOGEN UA: 1 mg/dL (ref 0.0–1.0)
pH: 5 (ref 5.0–8.0)

## 2014-05-28 LAB — COMPLETE METABOLIC PANEL WITH GFR
ALBUMIN: 3.7 g/dL (ref 3.5–5.2)
ALK PHOS: 113 U/L (ref 39–117)
ALT: 23 U/L (ref 0–35)
AST: 25 U/L (ref 0–37)
BILIRUBIN TOTAL: 1 mg/dL (ref 0.2–1.2)
BUN: 18 mg/dL (ref 6–23)
CO2: 25 mEq/L (ref 19–32)
Calcium: 9.3 mg/dL (ref 8.4–10.5)
Chloride: 95 mEq/L — ABNORMAL LOW (ref 96–112)
Creat: 0.84 mg/dL (ref 0.50–1.10)
GFR, EST AFRICAN AMERICAN: 72 mL/min
GFR, Est Non African American: 62 mL/min
Glucose, Bld: 104 mg/dL — ABNORMAL HIGH (ref 70–99)
Potassium: 5.1 mEq/L (ref 3.5–5.3)
Sodium: 133 mEq/L — ABNORMAL LOW (ref 135–145)
TOTAL PROTEIN: 6.5 g/dL (ref 6.0–8.3)

## 2014-05-28 LAB — C. DIFFICILE GDH AND TOXIN A/B
C. difficile GDH: DETECTED — AB
C. difficile Toxin A/B: NOT DETECTED

## 2014-05-28 LAB — LIPASE: Lipase: <10 U/L (ref 0–75)

## 2014-05-28 LAB — CLOSTRIDIUM DIFFICILE BY PCR: Toxigenic C. Difficile by PCR: DETECTED — CR

## 2014-05-28 MED ORDER — METRONIDAZOLE 500 MG PO TABS: 500.0000 mg | ORAL_TABLET | Freq: Three times a day (TID) | ORAL | Status: DC

## 2014-05-28 MED ORDER — CIPROFLOXACIN HCL 250 MG PO TABS: ORAL_TABLET | ORAL | Status: AC

## 2014-05-28 NOTE — Telephone Encounter (Signed)
Seth Bake, Will you please let the patient or her family know that it appears she has both a UTI and a bacterial infection in her bowels.  I've sent two antibiotics to her [facility] which should take care of both.  Ciprofloxacin and metronidazole.  We'll contact her once the urine culture returns.

## 2014-05-28 NOTE — Telephone Encounter (Signed)
Pt.notified

## 2014-05-29 LAB — URINE CULTURE: Colony Count: 35000

## 2014-06-11 ENCOUNTER — Telehealth: Payer: Self-pay

## 2014-06-11 NOTE — Telephone Encounter (Signed)
Home health, [facility], wants to evaluate for the need of OT and PT. I agreed and asked them to send an order.

## 2014-06-17 ENCOUNTER — Ambulatory Visit (INDEPENDENT_AMBULATORY_CARE_PROVIDER_SITE_OTHER): Payer: 59 | Admitting: Family Medicine

## 2014-06-17 ENCOUNTER — Encounter: Payer: Self-pay | Admitting: Family Medicine

## 2014-06-17 VITALS — BP 153/71 | HR 90 | Wt 152.0 lb

## 2014-06-17 DIAGNOSIS — K5732 Diverticulitis of large intestine without perforation or abscess without bleeding: Secondary | ICD-10-CM

## 2014-06-17 DIAGNOSIS — I10 Essential (primary) hypertension: Secondary | ICD-10-CM

## 2014-06-17 DIAGNOSIS — K56609 Unspecified intestinal obstruction, unspecified as to partial versus complete obstruction: Secondary | ICD-10-CM

## 2014-06-17 MED ORDER — AMLODIPINE BESYLATE 5 MG PO TABS: 5.0000 mg | ORAL_TABLET | Freq: Every day | ORAL | Status: DC

## 2014-06-17 MED ORDER — ONDANSETRON HCL 4 MG PO TABS: 4.0000 mg | ORAL_TABLET | Freq: Three times a day (TID) | ORAL | Status: DC | PRN

## 2014-06-17 NOTE — Progress Notes (Signed)
   Subjective:    Patient ID: Meredith Wilson, female    DOB: 08-11-1923, 80 y.o.   MRN: [id number]  HPI Here for hospital folllowup for diverticulitis and small bowel obstruction.  She was admitted here locally at [facility]. She stayed for about 4 days. They gave her IV fluids and IV antibiotics and she improved fairly quickly. They sent her home with 5 days of ciprofloxacin and metronidazole. None of her other medications were changed. Overall she is feeling much better. She does have a colostomy bag status post colon cancer.  Has some nausea twice since has been home. Completed her antibiotic about 4 days ago.   No fever or chills or sweat.s.  She felt like she could be a little bit more active. They have not had any palms with a colostomy bag or the wound site. She did have a CT scan while there confirming the diverticulitis. And possible small bowel obstruction. When she went to the emergency room she was vomiting, having diarrhea and her abdomen was severely bloated. She says that the bloating has actually gone down significantly in the past 2-3 days.  She gets her blood pressure has been more elevated in the 150s since being off of her blood pressure medication. We have stopped it because it had affected her sodium level.  Her daughter is here with her today.  Review of Systems     Objective:   Physical Exam  Constitutional: She is oriented to person, place, and time. She appears well-developed and well-nourished.  HENT:  Head: Normocephalic and atraumatic.  Cardiovascular: Normal rate, regular rhythm and normal heart sounds.   Pulmonary/Chest: Effort normal and breath sounds normal.  Abdominal: Soft. Bowel sounds are normal. She exhibits no distension and no mass. There is no tenderness. There is no rebound and no guarding.  Colostomy bag intact  Neurological: She is alert and oriented to person, place, and time.  Skin: Skin is warm and dry.  Psychiatric: She has a normal  mood and affect. Her behavior is normal.          Assessment & Plan:  Diverticulitis-resolved. She's doing fantastic. Continue with current regimen and monitor for any new symptoms. She's to call me if she notices any change in stools, increase in abdominal pain or bloating, or fevers chills or sweats.  Small bowel obstruction-resolved. If symptoms recur please emergency department immediately.  Hypertension-at one point we actually stopped her lisinopril HCT because it was affecting her sodium and potassium levels. Unfortunately her blood pressure has gone up since then and has been consistently running in the 150s over the last month or 2. We'll start amlodipine 5 mg daily. Followup in one month for blood pressure check.

## 2014-07-05 ENCOUNTER — Telehealth: Payer: Self-pay

## 2014-07-05 NOTE — Telephone Encounter (Signed)
Olivia Mackie from [facility] called and would like to do another 4 weeks 1 day a week on Mrs. Meredith Wilson for home care teaching on cardiac/pulinary teaching./Obelia Bonello,CMA

## 2014-07-05 NOTE — Telephone Encounter (Signed)
Okay for verbal order for additional home care.

## 2014-07-07 ENCOUNTER — Ambulatory Visit (INDEPENDENT_AMBULATORY_CARE_PROVIDER_SITE_OTHER): Payer: 59 | Admitting: Family Medicine

## 2014-07-07 ENCOUNTER — Encounter: Payer: Self-pay | Admitting: Family Medicine

## 2014-07-07 VITALS — BP 133/65 | HR 95 | Ht 66.0 in | Wt 148.0 lb

## 2014-07-07 DIAGNOSIS — J4541 Moderate persistent asthma with (acute) exacerbation: Secondary | ICD-10-CM

## 2014-07-07 DIAGNOSIS — J45901 Unspecified asthma with (acute) exacerbation: Secondary | ICD-10-CM

## 2014-07-07 MED ORDER — PREDNISONE 20 MG PO TABS: ORAL_TABLET | ORAL | Status: AC

## 2014-07-07 NOTE — Patient Instructions (Signed)
For the next two days, while you are awake, use an albuterol breathing treatment every four hours.  Then use as needed after two days.  Start the prednisone sent to [facility] as soon as possible.

## 2014-07-07 NOTE — Progress Notes (Signed)
CC: Meredith Wilson is a 80 y.o. female is here for Asthma   Subjective: HPI:  Patient complains of nonproductive cough with wheezing and shortness of breath that has been present for the past 2 days worsening on a daily basis. Has been using albuterol nebulizer twice a day which benefits the above symptoms greatly however only for a few hours. She is uncertain how often she can take a breathing treatment. She continues on Advair and Singulair. Denies blood in sputum, chest pain, confusion, facial pressure, runny nose, sore throat nor rash. Denies fevers, chills. Symptoms are persistent present all hours of the day and greatly interfering with sleep   Review Of Systems Outlined In HPI  Past Medical History  Diagnosis Date  . Hypertension     No past surgical history on file. Family History  Problem Relation Age of Onset  . Heart disease Brother     History   Social History  . Marital Status: Widowed    Spouse Name: N/A    Number of Children: N/A  . Years of Education: N/A   Occupational History  . Not on file.   Social History Main Topics  . Smoking status: Never Smoker   . Smokeless tobacco: Not on file  . Alcohol Use: Yes  . Drug Use: No  . Sexual Activity: No     Comment: 1 cffeine per day, walks for exercise, lives with daughter and son-n-law, uses a walker at home.   Other Topics Concern  . Not on file   Social History Narrative  . No narrative on file     Objective: BP 133/65  Pulse 95  Ht 5\' 6"  (1.676 m)  Wt 148 lb (67.132 kg)  BMI 23.90 kg/m2  SpO2 94%  General: Alert and Oriented, No Acute Distress HEENT: Pupils equal, round, reactive to light. Conjunctivae clear.  External ears unremarkable, canals clear with intact TMs with appropriate landmarks.  Middle ear appears open without effusion. Pink inferior turbinates.  Moist mucous membranes, pharynx without inflammation nor lesions.  Neck supple without palpable lymphadenopathy nor abnormal  masses. Lungs: Comfortable work of breathing, occasional coughing, mild diffuse wheezing in all lung fields on expiration. Breath sounds are distant however no rhonchi or rales. Cardiac: Regular rate and rhythm. Normal S1/S2.  No murmurs, rubs, nor gallops.   Extremities: No peripheral edema.  Strong peripheral pulses.  Mental Status: No depression, anxiety, nor agitation. Skin: Warm and dry.  Assessment & Plan: Meredith Wilson was seen today for asthma.  Diagnoses and associated orders for this visit:  Asthma with acute exacerbation, moderate persistent - predniSONE (DELTASONE) 20 MG tablet; Three tabs daily days 1-3, two tabs daily days 4-6, one tab daily days 7-9, half tab daily days 10-13.   Asthma exacerbation: She received Solu-Medrol 125 mg here in the clinic, I recommended that she get a chest x-ray to rule out pneumonia given distant breath sounds complicating exam however she politely declines. Start prednisone taper and I encouraged her to use albuterol nebulizer every 4 hours while awake for the next 48 hours   Return if symptoms worsen or fail to improve.

## 2014-07-12 ENCOUNTER — Ambulatory Visit: Payer: 59 | Admitting: Family Medicine

## 2014-07-22 ENCOUNTER — Ambulatory Visit (INDEPENDENT_AMBULATORY_CARE_PROVIDER_SITE_OTHER): Payer: 59 | Admitting: Family Medicine

## 2014-07-22 ENCOUNTER — Encounter: Payer: Self-pay | Admitting: Family Medicine

## 2014-07-22 VITALS — BP 130/64 | HR 102 | Ht 66.0 in | Wt 155.0 lb

## 2014-07-22 DIAGNOSIS — I10 Essential (primary) hypertension: Secondary | ICD-10-CM

## 2014-07-22 DIAGNOSIS — R011 Cardiac murmur, unspecified: Secondary | ICD-10-CM | POA: Insufficient documentation

## 2014-07-22 DIAGNOSIS — G252 Other specified forms of tremor: Secondary | ICD-10-CM

## 2014-07-22 DIAGNOSIS — G250 Essential tremor: Secondary | ICD-10-CM | POA: Insufficient documentation

## 2014-07-22 DIAGNOSIS — Z780 Asymptomatic menopausal state: Secondary | ICD-10-CM

## 2014-07-22 DIAGNOSIS — J45909 Unspecified asthma, uncomplicated: Secondary | ICD-10-CM

## 2014-07-22 HISTORY — DX: Essential tremor: G25.0

## 2014-07-22 HISTORY — DX: Cardiac murmur, unspecified: R01.1

## 2014-07-22 MED ORDER — AMBULATORY NON FORMULARY MEDICATION: Status: DC

## 2014-07-22 MED ORDER — ALPRAZOLAM 0.5 MG PO TABS: ORAL_TABLET | ORAL | Status: DC

## 2014-07-22 NOTE — Progress Notes (Signed)
   Subjective:    Patient ID: Meredith Wilson, female    DOB: 1923-09-04, 80 y.o.   MRN: [id number]  HPI Followup asthma-she was seen for an exacerbation approximately 2 weeks ago by one of my partners. She was put on the course of prednisone. She had a contrast x-ray of the time and did not have any fevers or chills to suggest a bacterial infection.  Using albuterol once a day.   Hypertension- Pt denies chest pain, SOB, dizziness, or heart palpitations.  Taking meds as directed w/o problems.  Denies medication side effects.  Home hleath has been taking her BP and it has been great at home.    Review of Systems     Objective:   Physical Exam  Constitutional: She is oriented to person, place, and time. She appears well-developed and well-nourished.  HENT:  Head: Normocephalic and atraumatic.  Cardiovascular: Normal rate and regular rhythm.   Murmur heard. 2/6 harsh systolic murmur  Pulmonary/Chest: Effort normal and breath sounds normal.  Neurological: She is alert and oriented to person, place, and time.  Skin: Skin is warm and dry.  Psychiatric: She has a normal mood and affect. Her behavior is normal.          Assessment & Plan:  Asthma - flare has resolved. Well controlled today.  On Advair and prn albuterol and singulair.   HTN - repeat blood pressure looks fantastic on current regimen. We'll continue with this. Followup in 3-4 months.  Due for CMP and fasting lipid panel.  Does need for shingles vaccine handout provided.  Discussed need for Prevnar 13. Handout provided. She wants to check on this first.

## 2014-09-07 ENCOUNTER — Encounter: Payer: 59 | Admitting: Family Medicine

## 2014-10-22 ENCOUNTER — Ambulatory Visit (INDEPENDENT_AMBULATORY_CARE_PROVIDER_SITE_OTHER): Payer: 59 | Admitting: Family Medicine

## 2014-10-22 ENCOUNTER — Encounter: Payer: Self-pay | Admitting: Family Medicine

## 2014-10-22 VITALS — BP 110/82 | HR 88 | Wt 161.0 lb

## 2014-10-22 DIAGNOSIS — F411 Generalized anxiety disorder: Secondary | ICD-10-CM

## 2014-10-22 DIAGNOSIS — J449 Chronic obstructive pulmonary disease, unspecified: Secondary | ICD-10-CM

## 2014-10-22 DIAGNOSIS — M542 Cervicalgia: Secondary | ICD-10-CM | POA: Insufficient documentation

## 2014-10-22 DIAGNOSIS — I10 Essential (primary) hypertension: Secondary | ICD-10-CM

## 2014-10-22 MED ORDER — ALPRAZOLAM 0.5 MG PO TABS: ORAL_TABLET | ORAL | Status: DC

## 2014-10-22 MED ORDER — HYDROCODONE-ACETAMINOPHEN 5-325 MG PO TABS: 1.0000 | ORAL_TABLET | ORAL | Status: DC | PRN

## 2014-10-22 MED ORDER — ONDANSETRON HCL 4 MG PO TABS: 4.0000 mg | ORAL_TABLET | Freq: Three times a day (TID) | ORAL | Status: DC | PRN

## 2014-10-22 NOTE — Assessment & Plan Note (Addendum)
Refilled her alprazolam today. Reminded her of the potential side effects of the medication. I did refill her medication today but encouraged her to try cut back to twice a day and then eventually cut down to half a tab in the morning and a whole tab in the evening. Warned about potential for sedation, increased risk of falls, increase risk of dementia and Alzheimer's. Followup in 6 months the

## 2014-10-22 NOTE — Assessment & Plan Note (Signed)
Doing well. No recent exacerbation of symptoms. She's not need refills on her inhalers today.

## 2014-10-22 NOTE — Assessment & Plan Note (Signed)
Well-controlled on current regimen. Followup in 6 months.  Due for blood work. Lab slip provided.

## 2014-10-22 NOTE — Assessment & Plan Note (Signed)
Did refill her pain medications today. Anchors her to her home physical therapy exercises. Will refill medication today. If worsens or not improving then consider x-ray

## 2014-10-22 NOTE — Progress Notes (Signed)
   Subjective:    Patient ID: Meredith Wilson, female    DOB: Dec 26, 1923, 80 y.o.   MRN: [id number]  Hypertension   Hypertension- Pt denies chest pain, SOB, dizziness, or heart palpitations.  Taking meds as directed w/o problems.  Denies medication side effects.    Anxiety - she takes  Her xanax TID.  Says occ will take BID. She has been on it for years   Neck pain - still has neck pain. Flares from time to time. She would like a refill on her hydrocodone. She did d PT.  Says still has her exercises at home but hasn;t been doing them.    Review of Systems     Objective:   Physical Exam  Constitutional: She is oriented to person, place, and time. She appears well-developed and well-nourished.  HENT:  Head: Normocephalic and atraumatic.  Cardiovascular: Normal rate, regular rhythm and normal heart sounds.   Pulmonary/Chest: Effort normal and breath sounds normal.  Neurological: She is alert and oriented to person, place, and time.  Skin: Skin is warm and dry.  Psychiatric: She has a normal mood and affect. Her behavior is normal.          Assessment & Plan:  HTN - well controlled on current regimen.   She is allergic to eggs, not a candidate for flu vaccine.  Discussed need for her Prevnar 13 vaccine

## 2014-10-27 LAB — COMPLETE METABOLIC PANEL WITH GFR
ALBUMIN: 4.2 g/dL (ref 3.5–5.2)
ALK PHOS: 98 U/L (ref 39–117)
ALT: 8 U/L (ref 0–35)
AST: 16 U/L (ref 0–37)
BUN: 14 mg/dL (ref 6–23)
CALCIUM: 9.3 mg/dL (ref 8.4–10.5)
CO2: 25 mEq/L (ref 19–32)
CREATININE: 0.78 mg/dL (ref 0.50–1.10)
Chloride: 101 mEq/L (ref 96–112)
GFR, EST NON AFRICAN AMERICAN: 68 mL/min
GFR, Est African American: 78 mL/min
GLUCOSE: 90 mg/dL (ref 70–99)
POTASSIUM: 4.5 meq/L (ref 3.5–5.3)
Sodium: 139 mEq/L (ref 135–145)
Total Bilirubin: 0.6 mg/dL (ref 0.2–1.2)
Total Protein: 7 g/dL (ref 6.0–8.3)

## 2014-10-27 LAB — LIPID PANEL
CHOL/HDL RATIO: 2.2 ratio
Cholesterol: 191 mg/dL (ref 0–200)
HDL: 85 mg/dL (ref >39)
LDL Cholesterol: 86 mg/dL (ref 0–99)
TRIGLYCERIDES: 99 mg/dL (ref <150)
VLDL: 20 mg/dL (ref 0–40)

## 2014-10-27 NOTE — Progress Notes (Signed)
Quick Note:  All labs are normal. ______ 

## 2014-11-21 ENCOUNTER — Other Ambulatory Visit: Payer: Self-pay | Admitting: Family Medicine

## 2015-01-17 ENCOUNTER — Encounter: Payer: Self-pay | Admitting: Family Medicine

## 2015-01-17 ENCOUNTER — Ambulatory Visit (INDEPENDENT_AMBULATORY_CARE_PROVIDER_SITE_OTHER): Payer: Medicare Other | Admitting: Family Medicine

## 2015-01-17 VITALS — BP 138/62 | HR 109 | Ht 62.0 in | Wt 162.0 lb

## 2015-01-17 DIAGNOSIS — F411 Generalized anxiety disorder: Secondary | ICD-10-CM

## 2015-01-17 DIAGNOSIS — M549 Dorsalgia, unspecified: Secondary | ICD-10-CM

## 2015-01-17 DIAGNOSIS — J449 Chronic obstructive pulmonary disease, unspecified: Secondary | ICD-10-CM

## 2015-01-17 DIAGNOSIS — I10 Essential (primary) hypertension: Secondary | ICD-10-CM

## 2015-01-17 DIAGNOSIS — M546 Pain in thoracic spine: Secondary | ICD-10-CM

## 2015-01-17 MED ORDER — HYDROCHLOROTHIAZIDE 25 MG PO TABS: 25.0000 mg | ORAL_TABLET | Freq: Every day | ORAL | Status: DC

## 2015-01-17 MED ORDER — AMBULATORY NON FORMULARY MEDICATION: Status: AC

## 2015-01-17 MED ORDER — ALBUTEROL SULFATE (2.5 MG/3ML) 0.083% IN NEBU
2.5000 mg | INHALATION_SOLUTION | Freq: Once | RESPIRATORY_TRACT | Status: AC
  Administered 2015-01-17: 2.5 mg via RESPIRATORY_TRACT

## 2015-01-17 MED ORDER — ALPRAZOLAM 0.5 MG PO TABS
ORAL_TABLET | ORAL | Status: DC
Start: 2015-01-17 — End: 2015-01-17

## 2015-01-17 MED ORDER — HYDROCODONE-ACETAMINOPHEN 5-325 MG PO TABS: 1.0000 | ORAL_TABLET | ORAL | Status: DC | PRN

## 2015-01-17 MED ORDER — ALPRAZOLAM 0.5 MG PO TABS: ORAL_TABLET | ORAL | Status: DC

## 2015-01-17 NOTE — Progress Notes (Signed)
Subjective:    Patient ID: Meredith Wilson, female    DOB: [birth year], 81 y.o.   MRN: [id number]  HPI Hypertension- Pt denies chest pain, SOB, dizziness, or heart palpitations.  Taking meds as directed w/o problems.  Denies medication side effects.  Home BPs in the 180s the last few weeks.    Anxiety - had long discussion last time about starting to decrease use of xanax to BID.   Notes went to ED about 2 weeks ago for dizziness and SOB.  She was given prednisone. BP was 181/71 while at the ED. She hasn't felt as dizzy since been home but feels like her asthma has been flaring over the last week. She's been a little bit more short of breath with some chest tightness. She denies any change in sputum or cough. Or recent upper rest for symptoms..    Has been having paIn on the posterior right shoulder blad.e Was lifting heavy things right before Christmas.  Otherwise she's not sure of any particular injury or trauma. She says sometimes it hurts between the shoulder blade and the spine and sometimes is noted top of the shoulder near her neck. She has tried using her anxiety medication and it does seem to help when she uses it.  Review of Systems  BP 138/62 mmHg  Pulse 109  Ht 5\' 2"  (1.575 m)  Wt 162 lb (73.483 kg)  BMI 29.62 kg/m2  SpO2 94%  PF 100 L/min    Allergies  Allergen Reactions  . Aspirin Swelling  . Eggs Or Egg-Derived Products   . Morphine And Related Hives  . Penicillins     REACTION: hives,throat swells    Past Medical History  Diagnosis Date  . Hypertension     History reviewed. No pertinent past surgical history.  History   Social History  . Marital Status: Widowed    Spouse Name: N/A    Number of Children: N/A  . Years of Education: N/A   Occupational History  . Not on file.   Social History Main Topics  . Smoking status: Never Smoker   . Smokeless tobacco: Not on file  . Alcohol Use: Yes  . Drug Use: No  . Sexual Activity: No     Comment: 1  cffeine per day, walks for exercise, lives with daughter and son-n-law, uses a walker at home.   Other Topics Concern  . Not on file   Social History Narrative    Family History  Problem Relation Age of Onset  . Heart disease Brother     Outpatient Encounter Prescriptions as of 01/17/2015  Medication Sig  . albuterol (PROVENTIL HFA;VENTOLIN HFA) 108 (90 BASE) MCG/ACT inhaler Inhale 1 puff into the lungs every 6 (six) hours as needed for wheezing or shortness of breath.  Marland Kitchen albuterol (PROVENTIL) (2.5 MG/3ML) 0.083% nebulizer solution 2.5 mg.  . amLODipine (NORVASC) 5 MG tablet TAKE 1 TABLET (5 MG TOTAL) BY MOUTH DAILY.  Marland Kitchen Cyanocobalamin (VITAMIN B 12 PO) Take by mouth.  . fluticasone-salmeterol (ADVAIR HFA) 115-21 MCG/ACT inhaler Inhale 1 puff into the lungs.  Marland Kitchen HYDROcodone-acetaminophen (NORCO/VICODIN) 5-325 MG per tablet Take 1 tablet by mouth every 4 (four) hours as needed for moderate pain.  Marland Kitchen ondansetron (ZOFRAN) 4 MG tablet Take 1 tablet (4 mg total) by mouth every 8 (eight) hours as needed for nausea or vomiting.  . [DISCONTINUED] ALPRAZolam (XANAX) 0.5 MG tablet TAKE 1 TABLET BY MOUTH THREE TIMES DAILY (Patient taking differently: Take 0.5 mg  by mouth 3 (three) times daily as needed. TAKE 1 TABLET BY MOUTH THREE TIMES DAILY)  . [DISCONTINUED] HYDROcodone-acetaminophen (NORCO/VICODIN) 5-325 MG per tablet Take 1 tablet by mouth every 4 (four) hours as needed for moderate pain.  Marland Kitchen ALPRAZolam (XANAX) 0.5 MG tablet TAKE 1 TABLET BY MOUTH THREE TIMES DAILY PRN  . AMBULATORY NON FORMULARY MEDICATION Medication Name: Nebulize with tubing for Asthma and COPD.  Can fax to home health  . hydrochlorothiazide (HYDRODIURIL) 25 MG tablet Take 1 tablet (25 mg total) by mouth daily.  . [DISCONTINUED] albuterol (PROVENTIL) (2.5 MG/3ML) 0.083% nebulizer solution Take 3 mLs (2.5 mg total) by nebulization every 6 (six) hours as needed for wheezing or shortness of breath. [name] may pick up.)  .  [DISCONTINUED] ALPRAZolam (XANAX) 0.5 MG tablet TAKE 1 TABLET BY MOUTH THREE TIMES DAILY  . [DISCONTINUED] AMBULATORY NON FORMULARY MEDICATION Medication Name: shingles IM x 1  . [DISCONTINUED] loratadine (CLARITIN) 10 MG tablet Take 1 tablet (10 mg total) by mouth daily.  . [DISCONTINUED] montelukast (SINGULAIR) 10 MG tablet Take 1 tablet (10 mg total) by mouth at bedtime.  Marland Kitchen albuterol (PROVENTIL) (2.5 MG/3ML) 0.083% nebulizer solution 2.5 mg           Objective:   Physical Exam  Constitutional: She is oriented to person, place, and time. She appears well-developed and well-nourished.  HENT:  Head: Normocephalic and atraumatic.  Cardiovascular: Normal rate, regular rhythm and normal heart sounds.   Pulmonary/Chest: Effort normal. She has wheezes.  Slight expiratory wheezing bilaterally. Completely resolved after albuterol treatment.  Musculoskeletal:  She is tender over the trapezius muscle between the shoulder blade and the spine as well as over the upper trapezius. I can actually palpate tender knots in the muscle tissue. Otherwise normal range of motion of the shoulder.  Neurological: She is alert and oriented to person, place, and time.  Skin: Skin is warm and dry.  Psychiatric: She has a normal mood and affect. Her behavior is normal.          Assessment & Plan:  HTN- not well controlled. It's a little bit of a mystery to me why her blood pressure is suddenly out of whack over the last 2-3 weeks. She's not had any changes to medication etc. No significant changes to pain. She takes her medications regularly and has not missed any doses. She says in fact she's actually cut saw out since Thanksgiving. We'll add Hydrocort thiazide 25 mg daily since she has great kidney function. I'll see her back in one month to recheck her blood pressure.  Anxiety -under fair control. She has been trying to wean back some with her Xanax to twice a day.  Right upper back pain - given handout to  do some stretches on her own at home. If not improving in the next 2 weeks and encouraged her to think about physical therapy either here in our building or with home health. I think this can be felt helpful for her. She does only has some palpable knots in the muscle tissue of the trapezius.  COPD - Wheezing-peak flows today in the red zone. After neb treatment she improved. She felt significantly better and wheezing resolved after treatment. Peak flows on ly improved mildly. Encouraged her to increase albuterol use to 3 times a day for the next few days and if she starts feeling better then can wean down to twice a day and then finally once a day. If she develops a cough or  changes in sputum then please let me know. If she's not responding to the increase albuterol and please call back and the next day or 2 and we can call in a course of prednisone.

## 2015-01-18 LAB — TSH: TSH: 4.006 u[IU]/mL (ref 0.350–4.500)

## 2015-01-28 ENCOUNTER — Ambulatory Visit (INDEPENDENT_AMBULATORY_CARE_PROVIDER_SITE_OTHER): Payer: Medicare Other

## 2015-01-28 ENCOUNTER — Other Ambulatory Visit: Payer: Self-pay | Admitting: Family Medicine

## 2015-01-28 ENCOUNTER — Encounter: Payer: Self-pay | Admitting: Family Medicine

## 2015-01-28 ENCOUNTER — Ambulatory Visit (INDEPENDENT_AMBULATORY_CARE_PROVIDER_SITE_OTHER): Payer: Medicare Other | Admitting: Family Medicine

## 2015-01-28 VITALS — BP 151/80 | HR 106

## 2015-01-28 DIAGNOSIS — J441 Chronic obstructive pulmonary disease with (acute) exacerbation: Secondary | ICD-10-CM

## 2015-01-28 DIAGNOSIS — R05 Cough: Secondary | ICD-10-CM

## 2015-01-28 DIAGNOSIS — R059 Cough, unspecified: Secondary | ICD-10-CM

## 2015-01-28 DIAGNOSIS — J449 Chronic obstructive pulmonary disease, unspecified: Secondary | ICD-10-CM

## 2015-01-28 DIAGNOSIS — R918 Other nonspecific abnormal finding of lung field: Secondary | ICD-10-CM

## 2015-01-28 DIAGNOSIS — J90 Pleural effusion, not elsewhere classified: Secondary | ICD-10-CM

## 2015-01-28 DIAGNOSIS — R0600 Dyspnea, unspecified: Secondary | ICD-10-CM

## 2015-01-28 MED ORDER — PREDNISONE 20 MG PO TABS: 40.0000 mg | ORAL_TABLET | Freq: Every day | ORAL | Status: DC

## 2015-01-28 MED ORDER — IPRATROPIUM-ALBUTEROL 0.5-2.5 (3) MG/3ML IN SOLN: 3.0000 mL | RESPIRATORY_TRACT | Status: DC | PRN

## 2015-01-28 MED ORDER — LEVOFLOXACIN 500 MG PO TABS: 500.0000 mg | ORAL_TABLET | Freq: Every day | ORAL | Status: DC

## 2015-01-28 MED ORDER — FUROSEMIDE 20 MG PO TABS: 20.0000 mg | ORAL_TABLET | Freq: Every day | ORAL | Status: DC

## 2015-01-28 MED ORDER — IPRATROPIUM-ALBUTEROL 0.5-2.5 (3) MG/3ML IN SOLN
3.0000 mL | Freq: Once | RESPIRATORY_TRACT | Status: AC
  Administered 2015-01-28: 3 mL via RESPIRATORY_TRACT

## 2015-01-28 NOTE — Progress Notes (Signed)
   Subjective:    Patient ID: Meredith Wilson, female    DOB: 02-11-1923, 81 y.o.   MRN: [id number]  HPI Cough-x3days cough and wheezing thick white and yellow mucus she took some cough med. Some sinus pain. No ST or ear pain.  No fever or temp or chills.  Using her albuterol  3 times a day.  Waking up at night to use albuterol the last couple of nights. On Advair. Daughter is with her here today.  Review of Systems     Objective:   Physical Exam  Constitutional: She is oriented to person, place, and time. She appears well-developed and well-nourished.  HENT:  Head: Normocephalic and atraumatic.  Right Ear: External ear normal.  Left Ear: External ear normal.  Nose: Nose normal.  Mouth/Throat: Oropharynx is clear and moist.  TMs and canals are clear.   Eyes: Conjunctivae and EOM are normal. Pupils are equal, round, and reactive to light.  Neck: Neck supple. No thyromegaly present.  Cardiovascular: Normal rate, regular rhythm and normal heart sounds.   Pulmonary/Chest: Effort normal. She has wheezes.  Diffuse inspiratory and expiratory wheezing. No inc work of breathing.   Lymphadenopathy:    She has no cervical adenopathy.  Neurological: She is alert and oriented to person, place, and time.  Skin: Skin is warm and dry.  Psychiatric: She has a normal mood and affect.          Assessment & Plan:  COPD exacerbation - Given duoneb tx in office and then 2nd albuterol neb.  Given solumedrol 125mg  IM. Will start prednisone tomorrow.  Will start Levaquin  F/U on [place] for recheck . Pulse ox is down to 90% when she got here today. She normally runs in the upper 90s. Pulse ox weeks it week ago was 94%. Go to Ed over weekend if get worse. After the second nebulizer treatment her pulse ox was still around 90%. There no increased work of breathing. I'm going to go ahead and get a chest x-ray to evaluate for pneumonia.

## 2015-02-01 ENCOUNTER — Encounter: Payer: Self-pay | Admitting: Family Medicine

## 2015-02-01 ENCOUNTER — Ambulatory Visit (INDEPENDENT_AMBULATORY_CARE_PROVIDER_SITE_OTHER): Payer: Medicare Other | Admitting: Family Medicine

## 2015-02-01 VITALS — BP 136/70 | HR 97 | Wt 154.0 lb

## 2015-02-01 DIAGNOSIS — J810 Acute pulmonary edema: Secondary | ICD-10-CM

## 2015-02-01 DIAGNOSIS — I10 Essential (primary) hypertension: Secondary | ICD-10-CM

## 2015-02-01 NOTE — Progress Notes (Signed)
   Subjective:    Patient ID: Meredith Wilson, female    DOB: 11/01/1923, 81 y.o.   MRN: [id number]  HPI here today to follow-up on acute illness. She was seen about 4 days ago for acute shortness of breath and cough. We did do a chest x-ray as I was concerned about the possibility of pneumonia. She had acute pulmonary edema. No effusion. We started her on furosemide in addition to antibiotics and prednisone to cover for COPD exacerbation. She says she's feeling more than 50% better. She did complete the backs and prednisone today. Her weight is actually down 8 pounds after 3 days of furosemide. She is taking hydrochlorothiazide as well.  Review of Systems     Objective:   Physical Exam  Constitutional: She is oriented to person, place, and time. She appears well-developed and well-nourished.  HENT:  Head: Normocephalic and atraumatic.  Cardiovascular: Normal rate, regular rhythm and normal heart sounds.   Pulmonary/Chest: Effort normal and breath sounds normal.  Neurological: She is alert and oriented to person, place, and time.  Skin: Skin is warm and dry.  Psychiatric: She has a normal mood and affect. Her behavior is normal.          Assessment & Plan:  Pulmonary edema-most suspicious for acute congestive heart failure. She's never had a diagnosis of this. Will schedule for echocardiogram for further evaluation. If this is the case she will need to be started on a beta blocker and an ACE inhibitor. It we may need to discontinue the amlodipine if we do need to switch her. For now continue with the hydrochlorothiazide. Check her baseline weight at home. If her weight increases by 3 pounds or more then encouraged her to take an extra furosemide.  HTN - well controlled. Today. Will f/u.

## 2015-02-02 ENCOUNTER — Other Ambulatory Visit: Payer: Self-pay | Admitting: *Deleted

## 2015-02-02 ENCOUNTER — Other Ambulatory Visit: Payer: Self-pay | Admitting: Family Medicine

## 2015-02-02 DIAGNOSIS — E876 Hypokalemia: Secondary | ICD-10-CM

## 2015-02-02 LAB — BASIC METABOLIC PANEL
BUN: 20 mg/dL (ref 6–23)
CHLORIDE: 92 meq/L — AB (ref 96–112)
CO2: 27 meq/L (ref 19–32)
Calcium: 9.4 mg/dL (ref 8.4–10.5)
Creat: 1.01 mg/dL (ref 0.50–1.10)
GLUCOSE: 99 mg/dL (ref 70–99)
Potassium: 3 mEq/L — ABNORMAL LOW (ref 3.5–5.3)
Sodium: 131 mEq/L — ABNORMAL LOW (ref 135–145)

## 2015-02-02 MED ORDER — POTASSIUM CHLORIDE ER 10 MEQ PO TBCR: 10.0000 meq | EXTENDED_RELEASE_TABLET | Freq: Every day | ORAL | Status: DC

## 2015-02-07 ENCOUNTER — Ambulatory Visit: Payer: Medicare Other | Admitting: Family Medicine

## 2015-02-09 ENCOUNTER — Ambulatory Visit (HOSPITAL_BASED_OUTPATIENT_CLINIC_OR_DEPARTMENT_OTHER)
Admission: RE | Admit: 2015-02-09 | Discharge: 2015-02-09 | Disposition: A | Discharge status: Home / Self Care | Payer: Medicare Other | Source: Ambulatory Visit | Attending: Family Medicine | Admitting: Family Medicine

## 2015-02-09 DIAGNOSIS — R05 Cough: Secondary | ICD-10-CM | POA: Diagnosis not present

## 2015-02-09 DIAGNOSIS — R0600 Dyspnea, unspecified: Secondary | ICD-10-CM

## 2015-02-09 DIAGNOSIS — J90 Pleural effusion, not elsewhere classified: Secondary | ICD-10-CM | POA: Diagnosis not present

## 2015-02-09 DIAGNOSIS — R059 Cough, unspecified: Secondary | ICD-10-CM

## 2015-02-09 DIAGNOSIS — I350 Nonrheumatic aortic (valve) stenosis: Secondary | ICD-10-CM | POA: Insufficient documentation

## 2015-02-09 NOTE — Progress Notes (Signed)
  Echocardiogram 2D Echocardiogram has been performed.  Meredith Wilson 02/09/2015, 1:57 PM

## 2015-02-10 LAB — POTASSIUM: Potassium: 4 mEq/L (ref 3.5–5.3)

## 2015-02-15 ENCOUNTER — Telehealth: Payer: Self-pay | Admitting: Emergency Medicine

## 2015-02-21 ENCOUNTER — Other Ambulatory Visit: Payer: Self-pay | Admitting: Family Medicine

## 2015-03-07 ENCOUNTER — Other Ambulatory Visit: Payer: Self-pay

## 2015-03-07 MED ORDER — HYDROCODONE-ACETAMINOPHEN 5-325 MG PO TABS: 1.0000 | ORAL_TABLET | ORAL | Status: DC | PRN

## 2015-03-13 ENCOUNTER — Other Ambulatory Visit: Payer: Self-pay | Admitting: Family Medicine

## 2015-04-12 ENCOUNTER — Encounter: Payer: Self-pay | Admitting: Physician Assistant

## 2015-04-12 ENCOUNTER — Ambulatory Visit (INDEPENDENT_AMBULATORY_CARE_PROVIDER_SITE_OTHER): Payer: Medicare Other | Admitting: Physician Assistant

## 2015-04-12 VITALS — BP 149/70 | HR 92 | Wt 157.0 lb

## 2015-04-12 DIAGNOSIS — J4521 Mild intermittent asthma with (acute) exacerbation: Secondary | ICD-10-CM

## 2015-04-12 MED ORDER — PREDNISONE 20 MG PO TABS: ORAL_TABLET | ORAL | Status: DC

## 2015-04-12 MED ORDER — HYDROCODONE-ACETAMINOPHEN 5-325 MG PO TABS: 1.0000 | ORAL_TABLET | ORAL | Status: DC | PRN

## 2015-04-12 MED ORDER — LEVOFLOXACIN 500 MG PO TABS: 500.0000 mg | ORAL_TABLET | Freq: Every day | ORAL | Status: DC

## 2015-04-12 MED ORDER — ALBUTEROL SULFATE (2.5 MG/3ML) 0.083% IN NEBU: 2.5000 mg | INHALATION_SOLUTION | RESPIRATORY_TRACT | Status: DC | PRN

## 2015-04-12 MED ORDER — METHYLPREDNISOLONE SODIUM SUCC 125 MG IJ SOLR
125.0000 mg | Freq: Once | INTRAMUSCULAR | Status: AC
  Administered 2015-04-12: 125 mg via INTRAVENOUS

## 2015-04-12 MED ORDER — ALBUTEROL SULFATE HFA 108 (90 BASE) MCG/ACT IN AERS: 1.0000 | INHALATION_SPRAY | Freq: Four times a day (QID) | RESPIRATORY_TRACT | Status: DC | PRN

## 2015-04-12 NOTE — Progress Notes (Signed)
   Subjective:    Patient ID: Meredith Wilson, female    DOB: 1923/02/02, 81 y.o.   MRN: [id number]  HPI  Pt is a 81 yo female who presents to the clinic with SOB and wheezing. She has a hx of asthma and exacerbation. She has not been taking claritin or doing her advair inhaler. For last week been more and more SOB. She does get some relief from nebulizer or rescue inhaler. No fever, chills, sinus presssure, ear pain or ST. Cough is mostly dry and worse at night.  .    Review of Systems  All other systems reviewed and are negative.      Objective:   Physical Exam  Constitutional: She appears well-developed and well-nourished.  HENT:  Head: Normocephalic and atraumatic.  Right Ear: External ear normal.  Left Ear: External ear normal.  Nose: Nose normal.  Mouth/Throat: Oropharynx is clear and moist. No oropharyngeal exudate.  Eyes: Conjunctivae are normal. Right eye exhibits no discharge. Left eye exhibits no discharge.  Neck: Normal range of motion. Neck supple.  Cardiovascular: Normal rate, regular rhythm and normal heart sounds.   Pulmonary/Chest:  Audible wheeze heard at rest without auscultation.  Bilateral lung wheezing on exam.  No rhonchi or crackles.   Lymphadenopathy:    She has no cervical adenopathy.  Psychiatric: She has a normal mood and affect. Her behavior is normal.          Assessment & Plan:  Acute asthma with exacerbation- pulse ox decreased from baseline at 96 percent. refilled albuterol solution and inhaler. Discussed same medicine. Pt is to use regularly 2-3 times a day for next 4 days then taper off as symptoms improve. Pt was given shot of solumedrol125mg  in office today. Start prednisone taper tomorrow. At this point I do not see signs of infection. She has gotten a lot of frequent CXR when sick recently. Will hold off. Given rx for levaquin discussed signs of turning into bacterial infection. They will have to start if needed. Encouraged pt to start  back on claritin and advair. Pt states advair is too expensive and wants to talk to pcp about it. Has appt soon and can do so.    Pt request norco refill. metheney last refilled 03/07/15. Verbal discussed with metheney. Refilled for this month. Pt has appt with her at end of month.

## 2015-04-12 NOTE — Patient Instructions (Signed)
Shot given in office today.  Albuterol nebulizer for next few days three times a day.  Prednisone taper.   If needed or worsening start levaquin.

## 2015-04-13 ENCOUNTER — Encounter: Payer: Self-pay | Admitting: Physician Assistant

## 2015-04-21 ENCOUNTER — Ambulatory Visit (INDEPENDENT_AMBULATORY_CARE_PROVIDER_SITE_OTHER): Payer: Medicare Other | Admitting: Family Medicine

## 2015-04-21 ENCOUNTER — Encounter: Payer: Self-pay | Admitting: Family Medicine

## 2015-04-21 VITALS — BP 121/71 | HR 110 | Wt 153.0 lb

## 2015-04-21 DIAGNOSIS — F329 Major depressive disorder, single episode, unspecified: Secondary | ICD-10-CM

## 2015-04-21 DIAGNOSIS — F32A Depression, unspecified: Secondary | ICD-10-CM

## 2015-04-21 DIAGNOSIS — F411 Generalized anxiety disorder: Secondary | ICD-10-CM | POA: Diagnosis not present

## 2015-04-21 DIAGNOSIS — I10 Essential (primary) hypertension: Secondary | ICD-10-CM

## 2015-04-21 DIAGNOSIS — J4520 Mild intermittent asthma, uncomplicated: Secondary | ICD-10-CM | POA: Diagnosis not present

## 2015-04-21 MED ORDER — ALPRAZOLAM 0.5 MG PO TABS: 0.5000 mg | ORAL_TABLET | Freq: Three times a day (TID) | ORAL | Status: DC | PRN

## 2015-04-21 NOTE — Progress Notes (Signed)
   Subjective:    Patient ID: Meredith Wilson, female    DOB: 1923/04/07, 81 y.o.   MRN: [id number]  HPI Hypertension- Pt denies chest pain, SOB, dizziness, or heart palpitations.  Taking meds as directed w/o problems.  Denies medication side effects.    GAD- she is doing well. Now down to BID on her xanax.  She will take an extra tab if needed.  Seen about a week ago for asthma exacerbation.  She completed the antibiotic but did stop the prednisone prematurely.  She was starting to get a rash and wasn't sure if it was the antibiotic the prednisone or another medication.   Review of Systems     Objective:   Physical Exam  Constitutional: She is oriented to person, place, and time. She appears well-developed and well-nourished.  HENT:  Head: Normocephalic and atraumatic.  Right Ear: External ear normal.  Left Ear: External ear normal.  Nose: Nose normal.  Mouth/Throat: Oropharynx is clear and moist.  TMs and canals are clear.   Eyes: Conjunctivae and EOM are normal. Pupils are equal, round, and reactive to light.  Neck: Neck supple. No thyromegaly present.  Cardiovascular: Normal rate, regular rhythm and normal heart sounds.   Pulmonary/Chest: Effort normal and breath sounds normal. She has no wheezes.  Lymphadenopathy:    She has no cervical adenopathy.  Neurological: She is alert and oriented to person, place, and time.  Skin: Skin is warm and dry.  Psychiatric: She has a normal mood and affect. Her behavior is normal.          Assessment & Plan:  Asthma - she is feeling better. Overall doing much better. She did stop the prednisone prematurely but feels much better and feels like she's back to her baseline. She was starting to get a rash and wasn't sure if it was the antibiotic the prednisone or another medication. Lungs sound great today.  Hypertension-well-controlled. Continue current regimen. Follow-up in 6 months.  GAD- doing well on the twice a day dosing with the  next or tab as needed. We'll go ahead and decrease her prescription to 80 tabs instead of 90 tabs. Refills place. Follow-up in 4 months. Again we reviewed the potential long-term effects of chronic and today's opinion's including dementia and fall risk.

## 2015-04-22 ENCOUNTER — Ambulatory Visit: Payer: 59 | Admitting: Family Medicine

## 2015-04-22 ENCOUNTER — Encounter: Payer: Self-pay | Admitting: Family Medicine

## 2015-04-24 ENCOUNTER — Other Ambulatory Visit: Payer: Self-pay | Admitting: Family Medicine

## 2015-05-08 ENCOUNTER — Other Ambulatory Visit: Payer: Self-pay | Admitting: Family Medicine

## 2015-06-09 ENCOUNTER — Emergency Department (INDEPENDENT_AMBULATORY_CARE_PROVIDER_SITE_OTHER)
Admission: EM | Admit: 2015-06-09 | Discharge: 2015-06-09 | Disposition: A | Discharge status: Home / Self Care | Payer: Medicare Other | Source: Home / Self Care | Attending: Family Medicine | Admitting: Family Medicine

## 2015-06-09 ENCOUNTER — Encounter: Payer: Self-pay | Admitting: *Deleted

## 2015-06-09 ENCOUNTER — Emergency Department (INDEPENDENT_AMBULATORY_CARE_PROVIDER_SITE_OTHER): Payer: Medicare Other

## 2015-06-09 DIAGNOSIS — R0602 Shortness of breath: Secondary | ICD-10-CM

## 2015-06-09 DIAGNOSIS — J069 Acute upper respiratory infection, unspecified: Secondary | ICD-10-CM

## 2015-06-09 DIAGNOSIS — R0989 Other specified symptoms and signs involving the circulatory and respiratory systems: Secondary | ICD-10-CM

## 2015-06-09 DIAGNOSIS — J441 Chronic obstructive pulmonary disease with (acute) exacerbation: Secondary | ICD-10-CM

## 2015-06-09 DIAGNOSIS — R05 Cough: Secondary | ICD-10-CM

## 2015-06-09 DIAGNOSIS — B9789 Other viral agents as the cause of diseases classified elsewhere: Secondary | ICD-10-CM

## 2015-06-09 HISTORY — DX: Unspecified asthma, uncomplicated: J45.909

## 2015-06-09 MED ORDER — IPRATROPIUM-ALBUTEROL 0.5-2.5 (3) MG/3ML IN SOLN
3.0000 mL | Freq: Once | RESPIRATORY_TRACT | Status: AC
  Administered 2015-06-09: 3 mL via RESPIRATORY_TRACT

## 2015-06-09 MED ORDER — METHYLPREDNISOLONE SODIUM SUCC 125 MG IJ SOLR
125.0000 mg | Freq: Once | INTRAMUSCULAR | Status: AC
  Administered 2015-06-09: 125 mg via INTRAMUSCULAR

## 2015-06-09 MED ORDER — PREDNISONE 20 MG PO TABS: 20.0000 mg | ORAL_TABLET | Freq: Two times a day (BID) | ORAL | Status: DC

## 2015-06-09 MED ORDER — IPRATROPIUM-ALBUTEROL 20-100 MCG/ACT IN AERS: 1.0000 | INHALATION_SPRAY | Freq: Four times a day (QID) | RESPIRATORY_TRACT | Status: DC | PRN

## 2015-06-09 MED ORDER — DOXYCYCLINE HYCLATE 100 MG PO CAPS: 100.0000 mg | ORAL_CAPSULE | Freq: Two times a day (BID) | ORAL | Status: DC

## 2015-06-09 MED ORDER — IPRATROPIUM-ALBUTEROL 0.5-2.5 (3) MG/3ML IN SOLN
3.0000 mL | RESPIRATORY_TRACT | Status: DC
  Administered 2015-06-09: 3 mL via RESPIRATORY_TRACT

## 2015-06-09 MED ORDER — BENZONATATE 200 MG PO CAPS: 200.0000 mg | ORAL_CAPSULE | Freq: Every day | ORAL | Status: DC

## 2015-06-09 NOTE — ED Provider Notes (Signed)
CSN: [id number]     Arrival date & time 06/09/15  1321 History   First MD Initiated Contact with Patient 06/09/15 1326     Chief Complaint  Patient presents with  . URI      HPI Comments: Patient reports that she has had chills for two days, and last night she developed a sore throat.  She has also had a productive cough and congestion, and intermittent shortness of breath for three days. She has a past history of asthma.  The history is provided by the patient.    Past Medical History  Diagnosis Date  . Hypertension   . Asthma    History reviewed. No pertinent past surgical history. Family History  Problem Relation Age of Onset  . Heart disease Brother    History  Substance Use Topics  . Smoking status: Never Smoker   . Smokeless tobacco: Not on file  . Alcohol Use: Yes   OB History    No data available     Review of Systems + sore throat + cough No pleuritic pain + wheezing + nasal congestion + post-nasal drainage No sinus pain/pressure No itchy/red eyes No earache No hemoptysis + SOB No fever, + chills No nausea No vomiting No abdominal pain No diarrhea No urinary symptoms No skin rash No fatigue No myalgias No headache Used OTC meds without relief  Allergies  Aspirin; Eggs or egg-derived products; Morphine and related; and Penicillins  Home Medications   Prior to Admission medications   Medication Sig Start Date End Date Taking? Authorizing Provider  albuterol (PROVENTIL HFA;VENTOLIN HFA) 108 (90 BASE) MCG/ACT inhaler Inhale 1 puff into the lungs every 6 (six) hours as needed for wheezing or shortness of breath. 04/12/15   Jade L Breeback, PA-C  albuterol (PROVENTIL) (2.5 MG/3ML) 0.083% nebulizer solution Take 3 mLs (2.5 mg total) by nebulization every 4 (four) hours as needed for wheezing or shortness of breath (please include nebulizer machine, hoses, and mask if needed.). 04/12/15   Jade L Breeback, PA-C  ALPRAZolam (XANAX) 0.5 MG tablet Take 1  tablet (0.5 mg total) by mouth 3 (three) times daily as needed for anxiety. TAKE 1 TABLET BY MOUTH THREE TIMES DAILY PRN 04/21/15   Hali Marry, MD  AMBULATORY NON FORMULARY MEDICATION Medication Name: Nebulize with tubing for Asthma and COPD.  Can fax to home health 01/17/15   Hali Marry, MD  amLODipine (NORVASC) 5 MG tablet TAKE 1 TABLET (5 MG TOTAL) BY MOUTH DAILY. 03/14/15   Hali Marry, MD  benzonatate (TESSALON) 200 MG capsule Take 1 capsule (200 mg total) by mouth at bedtime. Take as needed for cough 06/09/15   Kandra Nicolas, MD  Cyanocobalamin (VITAMIN B 12 PO) Take by mouth.    Historical Provider, MD  doxycycline (VIBRAMYCIN) 100 MG capsule Take 1 capsule (100 mg total) by mouth 2 (two) times daily. Take with food. 06/09/15   Kandra Nicolas, MD  furosemide (LASIX) 20 MG tablet TAKE 1 TABLET (20 MG TOTAL) BY MOUTH DAILY. 04/26/15   Hali Marry, MD  hydrochlorothiazide (HYDRODIURIL) 25 MG tablet TAKE 1 TABLET (25 MG TOTAL) BY MOUTH DAILY. 05/09/15   Hali Marry, MD  Ipratropium-Albuterol (COMBIVENT RESPIMAT) 20-100 MCG/ACT AERS respimat Inhale 1 puff into the lungs 4 (four) times daily as needed for wheezing. (max 6 doses per 24 hours) 06/09/15   Kandra Nicolas, MD  ondansetron (ZOFRAN) 4 MG tablet Take 1 tablet (4 mg total) by mouth  every 8 (eight) hours as needed for nausea or vomiting. 10/22/14   Hali Marry, MD  potassium chloride (K-DUR) 10 MEQ tablet Take 1 tablet (10 mEq total) by mouth daily. 02/02/15   Hali Marry, MD  predniSONE (DELTASONE) 20 MG tablet Take 1 tablet (20 mg total) by mouth 2 (two) times daily. Take with food. 06/09/15   Kandra Nicolas, MD   BP 150/74 mmHg  Pulse 101  Temp(Src) 98.6 F (37 C) (Oral)  Resp 18  SpO2 93% Physical Exam Nursing notes and Vital Signs reviewed. Appearance:  Patient appears stated age, and in no acute distress Eyes:  Pupils are equal, round, and reactive to light and accomodation.   Extraocular movement is intact.  Conjunctivae are not inflamed  Ears:  Canals normal.  Tympanic membranes normal.  Nose:  Mildly congested turbinates.  No sinus tenderness.   Pharynx:  Normal Neck:  Supple.  Slightly tender shotty posterior nodes are palpated bilaterally  Lungs:  Clear to auscultation.  Breath sounds are equal.  Heart:  Regular rate and rhythm without murmurs, rubs, or gallops.  Abdomen:  Nontender without masses or hepatosplenomegaly.  Bowel sounds are present.  No CVA or flank tenderness.  Extremities:  No edema.  No calf tenderness Skin:  No rash present.   ED Course  Procedures  None   Imaging Review Dg Chest 2 View  06/09/2015   CLINICAL DATA:  Cough, chest congestion, and shortness of breath.  EXAM: CHEST  2 VIEW  COMPARISON:  01/28/2015  FINDINGS: Heart size and pulmonary vascularity are normal. Calcification of the arch of the aorta. Chronic accentuation of the interstitial markings with hyperinflation consistent with emphysema. No acute infiltrates or effusions. No acute osseous abnormality. Old compression fracture of L1. Diffuse osteopenia.  IMPRESSION: No acute abnormality.   Electronically Signed   By: Lorriane Shire M.D.   On: 06/09/2015 13:58     MDM   1. COPD exacerbation   2. Viral URI with cough    Solumedrol 125mg  administered IM.  Duoneb by nebulizer.  Note improved SpO2 Begin doxycycline 100mg  BID.  Prednisone burst.  Prescription written for Benzonatate (Tessalon) to take at bedtime for night-time cough.  Take plain guaifenesin (1200mg  extended release tabs such as Mucinex) twice daily, with plenty of water, for cough and congestion.  Get adequate rest. Begin prednisone on Friday 06/10/15.   May use Afrin nasal spray (or generic oxymetazoline) twice daily for about 5 days.  Also recommend using saline nasal spray several times daily and saline nasal irrigation (AYR is a common brand).  Try warm salt water gargles for sore throat.  Stop all  antihistamines for now, and other non-prescription cough/cold preparations. Stop albuterol inhaler and begin Combivent Respimat. Follow-up with family doctor in one week. If symptoms become significantly worse during the night or over the weekend, proceed to the local emergency room.     Kandra Nicolas, MD 06/17/15 [phone]

## 2015-06-09 NOTE — Discharge Instructions (Signed)
Take plain guaifenesin (1200mg  extended release tabs such as Mucinex) twice daily, with plenty of water, for cough and congestion.  Get adequate rest. Begin prednisone on Friday 06/10/15.   May use Afrin nasal spray (or generic oxymetazoline) twice daily for about 5 days.  Also recommend using saline nasal spray several times daily and saline nasal irrigation (AYR is a common brand).  Try warm salt water gargles for sore throat.  Stop all antihistamines for now, and other non-prescription cough/cold preparations. Stop albuterol inhaler and begin Combivent Respimat. Follow-up with family doctor in one week. If symptoms become significantly worse during the night or over the weekend, proceed to the local emergency room.

## 2015-06-09 NOTE — ED Notes (Signed)
Pt c/o 3 days of productive cough and congestion without fever. She has h/o asthma. Afebrile, currently SOB with tremors.

## 2015-06-11 ENCOUNTER — Other Ambulatory Visit: Payer: Self-pay | Admitting: Family Medicine

## 2015-06-21 ENCOUNTER — Other Ambulatory Visit: Payer: Self-pay | Admitting: Family Medicine

## 2015-06-21 ENCOUNTER — Other Ambulatory Visit: Payer: Self-pay | Admitting: *Deleted

## 2015-06-29 ENCOUNTER — Encounter: Payer: Self-pay | Admitting: Emergency Medicine

## 2015-06-29 ENCOUNTER — Emergency Department (INDEPENDENT_AMBULATORY_CARE_PROVIDER_SITE_OTHER)
Admission: EM | Admit: 2015-06-29 | Discharge: 2015-06-29 | Disposition: A | Discharge status: Home / Self Care | Payer: Medicare Other | Source: Home / Self Care | Attending: Family Medicine | Admitting: Family Medicine

## 2015-06-29 DIAGNOSIS — J9801 Acute bronchospasm: Secondary | ICD-10-CM

## 2015-06-29 MED ORDER — BECLOMETHASONE DIPROPIONATE 40 MCG/ACT IN AERS: 1.0000 | INHALATION_SPRAY | Freq: Two times a day (BID) | RESPIRATORY_TRACT | Status: DC

## 2015-06-29 NOTE — ED Notes (Signed)
Reports some shortness of breath today; has cough that causes mild discomfort. Has used nebulizer treatment q 5 hours as directed; has used inhaler x 1 today. No signs of severe distress; talking and smiling.

## 2015-06-29 NOTE — Discharge Instructions (Signed)
May use albuterol inhaler as needed. 

## 2015-06-29 NOTE — ED Provider Notes (Signed)
CSN: [id number]     Arrival date & time 06/29/15  1357 History   First MD Initiated Contact with Patient 06/29/15 1446     Chief Complaint  Patient presents with  . Shortness of Breath      HPI Comments: Patient reports that her respiratory infection has resolved, but she still has a persistent cough and wheezing.  Her cough and wheezing improve somewhat after using albuterol by nebulizer and her inhaler.  No pleuritic pain.  No fevers, chills, and sweats.                                                                                                                                                                                                                                                                                                 The history is provided by the patient.    Past Medical History  Diagnosis Date  . Hypertension   . Asthma    History reviewed. No pertinent past surgical history. Family History  Problem Relation Age of Onset  . Heart disease Brother    History  Substance Use Topics  . Smoking status: Never Smoker   . Smokeless tobacco: Not on file  . Alcohol Use: Yes   OB History    No data available     Review of Systems No sore throat + cough No pleuritic pain + wheezing No nasal congestion No post-nasal drainage No sinus pain/pressure No itchy/red eyes No earache No hemoptysis + SOB No fever/chills No nausea No vomiting No abdominal pain No diarrhea No urinary symptoms No skin rash + fatigue No myalgias No headache    Allergies  Aspirin; Eggs or egg-derived products; Morphine and related; and Penicillins  Home Medications   Prior to Admission medications   Medication Sig Start Date End Date Taking? Authorizing Provider  albuterol (PROVENTIL HFA;VENTOLIN HFA) 108 (90 BASE) MCG/ACT inhaler Inhale 1 puff into the lungs every 6 (six) hours as needed for wheezing or shortness of breath. 04/12/15   Jade L Breeback, PA-C  albuterol  (PROVENTIL) (2.5 MG/3ML) 0.083% nebulizer solution Take 3 mLs (2.5  mg total) by nebulization every 4 (four) hours as needed for wheezing or shortness of breath (please include nebulizer machine, hoses, and mask if needed.). 04/12/15   Jade L Breeback, PA-C  albuterol (PROVENTIL) (2.5 MG/3ML) 0.083% nebulizer solution USE ONE VIAL IN NEBULIZER EVERY 6 HOURS AS NEEDED FOR WHEEZING OR SHORTNESS OF BREATH 06/13/15   Hali Marry, MD  albuterol (PROVENTIL) (2.5 MG/3ML) 0.083% nebulizer solution TAKE 3 MLS (2.5 MG TOTAL) BY NEBULIZATION EVERY 2 (TWO) HOURS AS NEEDED FOR SHORTNESS OF BREATH. 06/21/15   Hali Marry, MD  ALPRAZolam Duanne Moron) 0.5 MG tablet Take 1 tablet (0.5 mg total) by mouth 3 (three) times daily as needed for anxiety. TAKE 1 TABLET BY MOUTH THREE TIMES DAILY PRN 04/21/15   Hali Marry, MD  AMBULATORY NON FORMULARY MEDICATION Medication Name: Nebulize with tubing for Asthma and COPD.  Can fax to home health 01/17/15   Hali Marry, MD  amLODipine (NORVASC) 5 MG tablet TAKE 1 TABLET (5 MG TOTAL) BY MOUTH DAILY. 03/14/15   Hali Marry, MD  beclomethasone (QVAR) 40 MCG/ACT inhaler Inhale 1 puff into the lungs 2 (two) times daily. 06/29/15   Kandra Nicolas, MD  benzonatate (TESSALON) 200 MG capsule Take 1 capsule (200 mg total) by mouth at bedtime. Take as needed for cough 06/09/15   Kandra Nicolas, MD  Cyanocobalamin (VITAMIN B 12 PO) Take by mouth.    Historical Provider, MD  furosemide (LASIX) 20 MG tablet TAKE 1 TABLET (20 MG TOTAL) BY MOUTH DAILY. 04/26/15   Hali Marry, MD  hydrochlorothiazide (HYDRODIURIL) 25 MG tablet TAKE 1 TABLET (25 MG TOTAL) BY MOUTH DAILY. 05/09/15   Hali Marry, MD  Ipratropium-Albuterol (COMBIVENT RESPIMAT) 20-100 MCG/ACT AERS respimat Inhale 1 puff into the lungs 4 (four) times daily as needed for wheezing. (max 6 doses per 24 hours) 06/09/15   Kandra Nicolas, MD  ondansetron (ZOFRAN) 4 MG tablet Take 1 tablet (4 mg  total) by mouth every 8 (eight) hours as needed for nausea or vomiting. 10/22/14   Hali Marry, MD  potassium chloride (K-DUR) 10 MEQ tablet Take 1 tablet (10 mEq total) by mouth daily. 02/02/15   Hali Marry, MD   BP 133/72 mmHg  Pulse 92  Temp(Src) 98.4 F (36.9 C) (Oral)  Resp 18  SpO2 96% Physical Exam Nursing notes and Vital Signs reviewed. Appearance:  Patient appears stated age, and in no acute distress.  She has a resting tremor. Eyes:  Pupils are equal, round, and reactive to light and accomodation.  Extraocular movement is intact.  Conjunctivae are not inflamed  Ears:  Canals normal.  Tympanic membranes normal.  Nose:  Mildly congested turbinates.  No sinus tenderness.  Pharynx:  Normal; moist mucous membranes  Neck:  Supple.   No adenopathy Lungs:   Breath sounds are distant but equal.  No rales, rhonchi, or wheezes.     Heart:  Regular rate and rhythm without murmurs, rubs, or gallops.  Abdomen:  Nontender without masses or hepatosplenomegaly.  Bowel sounds are present.  No CVA or flank tenderness.  Extremities:  No edema.  No calf tenderness Skin:  No rash present.   ED Course  Procedures  none   MDM   1. Bronchospasm; suspect post-infectious cough    Begin QVAR, one puff BID Followup with Family Doctor in about two weeks.    Kandra Nicolas, MD 06/29/15 [phone]

## 2015-07-05 ENCOUNTER — Other Ambulatory Visit: Payer: Self-pay

## 2015-07-05 ENCOUNTER — Other Ambulatory Visit: Payer: Self-pay | Admitting: Family Medicine

## 2015-07-05 MED ORDER — ALBUTEROL SULFATE HFA 108 (90 BASE) MCG/ACT IN AERS: 1.0000 | INHALATION_SPRAY | Freq: Four times a day (QID) | RESPIRATORY_TRACT | Status: DC | PRN

## 2015-07-12 ENCOUNTER — Encounter: Payer: Self-pay | Admitting: Sports Medicine

## 2015-07-12 ENCOUNTER — Ambulatory Visit (INDEPENDENT_AMBULATORY_CARE_PROVIDER_SITE_OTHER): Payer: Medicare Other | Admitting: Sports Medicine

## 2015-07-12 VITALS — BP 135/75 | HR 103 | Temp 97.7°F

## 2015-07-12 DIAGNOSIS — J4489 Other specified chronic obstructive pulmonary disease: Secondary | ICD-10-CM

## 2015-07-12 DIAGNOSIS — J449 Chronic obstructive pulmonary disease, unspecified: Secondary | ICD-10-CM | POA: Diagnosis not present

## 2015-07-12 MED ORDER — TIOTROPIUM BROMIDE-OLODATEROL 2.5-2.5 MCG/ACT IN AERS: 2.0000 | INHALATION_SPRAY | Freq: Every day | RESPIRATORY_TRACT | Status: DC

## 2015-07-12 MED ORDER — ALBUTEROL SULFATE (2.5 MG/3ML) 0.083% IN NEBU: 2.5000 mg | INHALATION_SOLUTION | RESPIRATORY_TRACT | Status: DC | PRN

## 2015-07-12 MED ORDER — BUDESONIDE-FORMOTEROL FUMARATE 160-4.5 MCG/ACT IN AERO: 2.0000 | INHALATION_SPRAY | Freq: Two times a day (BID) | RESPIRATORY_TRACT | Status: DC

## 2015-07-12 NOTE — Progress Notes (Signed)
  Subjective:    CC:  COPD  HPI: This is a pleasant 81 year old female, she has a history of COPD, she has been on Qvar as well as nebulized albuterol. Unfortunately in the past couple of months she's had 3-4 exacerbations.  Each treated with steroids and  Anti-biotics, unfortunately continues to have severe symptoms of shortness of breath, cough, wheeze. No chest pain. No fevers or chills. She feels as though she is back to her baseline. She is a nonsmoker and has no irritant exposures.  Past medical history, Surgical history, Family history not pertinant except as noted below, Social history, Allergies, and medications have been entered into the medical record, reviewed, and no changes needed.   Review of Systems: No fevers, chills, night sweats, weight loss, chest pain, or shortness of breath.   Objective:    General: Well Developed, well nourished, and in no acute distress.  Neuro: Alert and oriented x3, extra-ocular muscles intact, sensation grossly intact.  Visible essential tremor HEENT: Normocephalic, atraumatic, pupils equal round reactive to light, neck supple, no masses, no lymphadenopathy, thyroid nonpalpable.  Skin: Warm and dry, no rashes. Cardiac: Regular rate and rhythm, no murmurs rubs or gallops, no lower extremity edema.  Respiratory:  Cor sounds throughout with expiratory wheezes and prolonged expiratory phase. Not using accessory muscles, speaking in full sentences.   I spent time training her how to use Symbicort and tiotropium inhalers.  Impression and Recommendations:    I spent 40 minutes with this patient, greater than 50% was face-to-face time counseling regarding the above diagnoses

## 2015-07-12 NOTE — Assessment & Plan Note (Signed)
Insufficient control with inhaled corticosteroids.  We are going to switch to inhaled cortical steroids/long-acting beta agonist,. I'm also going to add an inhaled anticholinergic.

## 2015-07-17 ENCOUNTER — Other Ambulatory Visit: Payer: Self-pay | Admitting: Family Medicine

## 2015-08-02 ENCOUNTER — Ambulatory Visit (INDEPENDENT_AMBULATORY_CARE_PROVIDER_SITE_OTHER): Payer: Medicare Other | Admitting: Family Medicine

## 2015-08-02 ENCOUNTER — Encounter: Payer: Self-pay | Admitting: Family Medicine

## 2015-08-02 VITALS — BP 166/64 | HR 98 | Wt 156.0 lb

## 2015-08-02 DIAGNOSIS — J449 Chronic obstructive pulmonary disease, unspecified: Secondary | ICD-10-CM

## 2015-08-02 MED ORDER — HYDROCHLOROTHIAZIDE 25 MG PO TABS: ORAL_TABLET | ORAL | Status: DC

## 2015-08-02 MED ORDER — ALPRAZOLAM 0.5 MG PO TABS: 0.5000 mg | ORAL_TABLET | Freq: Three times a day (TID) | ORAL | Status: DC | PRN

## 2015-08-02 MED ORDER — UMECLIDINIUM-VILANTEROL 62.5-25 MCG/INH IN AEPB: 1.0000 | INHALATION_SPRAY | Freq: Every day | RESPIRATORY_TRACT | Status: DC

## 2015-08-02 MED ORDER — BECLOMETHASONE DIPROPIONATE 80 MCG/ACT IN AERS: 2.0000 | INHALATION_SPRAY | Freq: Two times a day (BID) | RESPIRATORY_TRACT | Status: DC

## 2015-08-02 MED ORDER — HYDROCODONE-ACETAMINOPHEN 5-325 MG PO TABS
1.0000 | ORAL_TABLET | ORAL | Status: DC | PRN
Start: 2015-08-02 — End: 2015-11-22

## 2015-08-02 NOTE — Progress Notes (Signed)
   Subjective:    Patient ID: Meredith Wilson, female    DOB: Sep 19, 1923, 81 y.o.   MRN: [id number]  HPI F/U COPD exacerbation - she was seen about 3 weeks ago for COPD exacerbation. She actually went to urgent care twice and then finally came to our office to be seen. Importantly she struggled for almost a month with symptoms. She was started on Stiolto which actually worked really well and she's been breathing much better since then. She feels like she's finally recovering from his last episode. She's also been using Symbicort. She completed the prednisone and antibiotic. No fevers chills or sweats. She still noticing a little bit short of breath when she lays down at night but otherwise is much better.  Review of Systems     Objective:   Physical Exam  Constitutional: She is oriented to person, place, and time. She appears well-developed and well-nourished.  HENT:  Head: Normocephalic and atraumatic.  Cardiovascular: Normal rate, regular rhythm and normal heart sounds.   Pulmonary/Chest: Effort normal and breath sounds normal.  Neurological: She is alert and oriented to person, place, and time.  Skin: Skin is warm and dry.  Psychiatric: She has a normal mood and affect. Her behavior is normal.          Assessment & Plan:  COPD-I do believe she is a most back to baseline. The Stiolto was not covered by insurance so we'll switch to Anoro instead. Demonstrated on how to use the device. We'll also discontinue Symbicort and put her back on Qvar. That way she'll be on an anticholinergic, long-acting albuterol, and inhaled steroid. Keep follow-up at the end of the month. Discussed that she does need the Prevnar 13 vaccine but will hold off until her visit at the end of the month when she is hopefully feeling much better.she just filled her Symbicort and doesn't want to have to buy another inhaler right now so encouraged her to decrease her dosing to 1 puff twice a day until she uses it  happening can switch to Qvar.

## 2015-08-15 ENCOUNTER — Other Ambulatory Visit: Payer: Self-pay | Admitting: Family Medicine

## 2015-08-18 ENCOUNTER — Other Ambulatory Visit: Payer: Self-pay | Admitting: Family Medicine

## 2015-08-22 ENCOUNTER — Ambulatory Visit (INDEPENDENT_AMBULATORY_CARE_PROVIDER_SITE_OTHER): Payer: Medicare Other | Admitting: Family Medicine

## 2015-08-22 ENCOUNTER — Encounter: Payer: Self-pay | Admitting: Family Medicine

## 2015-08-22 VITALS — BP 149/69 | HR 100

## 2015-08-22 DIAGNOSIS — I10 Essential (primary) hypertension: Secondary | ICD-10-CM

## 2015-08-22 DIAGNOSIS — J449 Chronic obstructive pulmonary disease, unspecified: Secondary | ICD-10-CM

## 2015-08-22 DIAGNOSIS — R140 Abdominal distension (gaseous): Secondary | ICD-10-CM | POA: Diagnosis not present

## 2015-08-22 NOTE — Progress Notes (Signed)
   Subjective:    Patient ID: Meredith Wilson, female    DOB: 02-24-1923, 81 y.o.   MRN: [id number]  HPI COPD - she did have a episode where she felt SOB about 4 hours 2 days ago.  She is feeling some better today.  She did use her albuterol.  No chest pain.    Her abdominal has been more swollen. Say her bowels have been moving in typical fashion. She has been more gassy or belching.  No blood in the stool.  No pain. iliostomy bag is working well. No abdominal pain.  Hypertension- Pt denies chest pain, SOB, dizziness, or heart palpitations.  Taking meds as directed w/o problems.  Denies medication side effects.      Review of Systems     Objective:   Physical Exam  Constitutional: She is oriented to person, place, and time. She appears well-developed and well-nourished.  HENT:  Head: Normocephalic and atraumatic.  Cardiovascular: Normal rate and regular rhythm.   Murmur heard. 2/6 SEM   Best heard right sternal border.   Pulmonary/Chest: Effort normal and breath sounds normal.  Neurological: She is alert and oriented to person, place, and time.  Skin: Skin is warm and dry.  Psychiatric: She has a normal mood and affect. Her behavior is normal.          Assessment & Plan:  COPD - stable. F/U in 3 months. Continue current regimen of Qvar and anoro.  Bloating - recommend trial of a probiotic. If she starts developing abdomen is getting more hard or firm or she starts to develop pain or decrease in bowel movements then please let us know immediately.  Hypertension-under fair control. Just borderline today. Based on her age acceptable as long as blood pressure is running under 572 systolic.

## 2015-08-22 NOTE — Patient Instructions (Signed)
Can try taking a probiotic to help with the bloating.

## 2015-08-27 ENCOUNTER — Other Ambulatory Visit: Payer: Self-pay | Admitting: Family Medicine

## 2015-10-28 ENCOUNTER — Other Ambulatory Visit: Payer: Self-pay | Admitting: Family Medicine

## 2015-11-22 ENCOUNTER — Encounter: Payer: Self-pay | Admitting: Family Medicine

## 2015-11-22 ENCOUNTER — Ambulatory Visit (INDEPENDENT_AMBULATORY_CARE_PROVIDER_SITE_OTHER): Payer: Medicare Other | Admitting: Family Medicine

## 2015-11-22 VITALS — BP 150/68 | HR 89 | Temp 98.4°F | Resp 18 | Wt 160.4 lb

## 2015-11-22 DIAGNOSIS — R140 Abdominal distension (gaseous): Secondary | ICD-10-CM

## 2015-11-22 DIAGNOSIS — F411 Generalized anxiety disorder: Secondary | ICD-10-CM

## 2015-11-22 DIAGNOSIS — I10 Essential (primary) hypertension: Secondary | ICD-10-CM | POA: Diagnosis not present

## 2015-11-22 DIAGNOSIS — J069 Acute upper respiratory infection, unspecified: Secondary | ICD-10-CM | POA: Diagnosis not present

## 2015-11-22 DIAGNOSIS — R21 Rash and other nonspecific skin eruption: Secondary | ICD-10-CM

## 2015-11-22 MED ORDER — TRIAMCINOLONE ACETONIDE 0.1 % EX CREA: 1.0000 "application " | TOPICAL_CREAM | Freq: Two times a day (BID) | CUTANEOUS | Status: DC | PRN

## 2015-11-22 MED ORDER — ALPRAZOLAM 0.5 MG PO TABS: 0.5000 mg | ORAL_TABLET | Freq: Two times a day (BID) | ORAL | Status: DC | PRN

## 2015-11-22 MED ORDER — HYDROCODONE-ACETAMINOPHEN 5-325 MG PO TABS: 1.0000 | ORAL_TABLET | ORAL | Status: DC | PRN

## 2015-11-22 NOTE — Patient Instructions (Signed)
Stop furosemide. Call if rash is not better in 2 weeks.    Call on Monday if your cold is not better or call us sooner if you get worse.    Think about the shingles vaccine.

## 2015-11-22 NOTE — Progress Notes (Signed)
   Subjective:    Patient ID: Meredith Wilson, female    DOB: [birth year], 81 y.o.   MRN: [id number]  HPI Hypertension- Pt denies chest pain, SOB, dizziness, or heart palpitations.  Taking meds as directed w/o problems.  Denies medication side effects.    Bloating - she is doing well.    Rash on both arm.  Has had it on and off x 1 month. It is itchey. She is using Safegurad.  Feels like the furosemid makes it worse.    Anxiety -  She is doing well overall. She said Sunday she nly takes one of her Xanax that she's really been trying to decrease the frequency of which sh is using it. But does notice a difference if she skips the medication.   She's also had some cold symptoms for about 3 days She's had a little bit of nasal congestion and  Slihtcough. She did hav a sore throat the first day that ays that's actually better. No evers chills or sweats. Noshortness of breath or wheezng.  Review of Systems     Objective:   Physical Exam  Constitutional: She is oriented to person, place, and time. She appears well-developed and well-nourished.  HENT:  Head: Normocephalic and atraumatic.  Right Ear: External ear normal.  Left Ear: External ear normal.  Nose: Nose normal.  Mouth/Throat: Oropharynx is clear and moist.  TMs and canals are clear.   Eyes: Conjunctivae and EOM are normal. Pupils are equal, round, and reactive to light.  Neck: Neck supple. No thyromegaly present.  Cardiovascular: Normal rate, regular rhythm and normal heart sounds.   Pulmonary/Chest: Effort normal and breath sounds normal. She has no wheezes.  Lymphadenopathy:    She has no cervical adenopathy.  Neurological: She is alert and oriented to person, place, and time.  Skin: Skin is warm and dry.  Psychiatric: She has a normal mood and affect.          Assessment & Plan:  HTN - borderline today. Not maximally controlled. We are stopping lasix due ot possible allergic reaction.   Bloating -  She is feeling much  better overall.  Rash -  Could possibly be an allergic reaction to the furosemide. She's a Foley notices a difference on the days that she takes t it seems to be worse and more intense. She really doesn't have any problem with fluid or lower extremity edema so we will try stopping the furosemide and see how she does with just the hydrochlorothiazide. I will see her back in about 3 months. If the rash does not improve then I will see her back in about 2 weeks. I did send overa topical steroid cream to help her with the itch so that she's not scratching at it.  URI -  Likely viral.gave reassurance. If not better in one week and give [place] a call back or call sooner if suddely gets worse.   anxiety-we'll continue to wean down the Xnax. She was previously taking 90 per month and we decreased her down to 80 per month bout 4 months ago. Will now refil for 70 per month for the next 4 months and just slowly continue to wean. Encouraed herto try to use it sparingly.

## 2015-12-07 ENCOUNTER — Other Ambulatory Visit: Payer: Self-pay | Admitting: Family Medicine

## 2015-12-15 ENCOUNTER — Ambulatory Visit (INDEPENDENT_AMBULATORY_CARE_PROVIDER_SITE_OTHER): Payer: Medicare Other | Admitting: Family Medicine

## 2015-12-15 ENCOUNTER — Encounter: Payer: Self-pay | Admitting: Family Medicine

## 2015-12-15 VITALS — BP 173/50 | HR 84

## 2015-12-15 DIAGNOSIS — R21 Rash and other nonspecific skin eruption: Secondary | ICD-10-CM | POA: Diagnosis not present

## 2015-12-15 DIAGNOSIS — L309 Dermatitis, unspecified: Secondary | ICD-10-CM

## 2015-12-15 DIAGNOSIS — F411 Generalized anxiety disorder: Secondary | ICD-10-CM

## 2015-12-15 MED ORDER — SERTRALINE HCL 50 MG PO TABS: ORAL_TABLET | ORAL | Status: DC

## 2015-12-15 MED ORDER — TRIAMCINOLONE ACETONIDE 0.1 % EX CREA: 1.0000 "application " | TOPICAL_CREAM | Freq: Two times a day (BID) | CUTANEOUS | Status: DC | PRN

## 2015-12-15 NOTE — Progress Notes (Signed)
Subjective:    Patient ID: Meredith Wilson, female    DOB: 1923-02-08, 81 y.o.   MRN: [id number]  HPI here today to follow-up on rash on her arms. I saw her about 3 weeks ago for follow-up routine visit and she had mentioned a rash on her arms. At that time we thought it might be from the Lasix as this was a newer medication we decided to stop it. She's not taking HCTZ as well. She denies any new creams lotions perfumes dyes 5 or 6 etc. It is only on her arms and not elsewhere on her body. She says that she will fill a creeping crawling sensation under the skin and then get an intense itching that she feels like she has to scratch. This is causing some excoriation and bruising on the forearms. She says it doesn't happen anywhere else. She did use the steroid cream that I gave her which was transitional 0.1%. It was helpful. She did run out and start using some over-the-counter hydrocortisone. She says that has been almost equally as effective. She's also been using a little bit ago by pallor. She also has tried some lidocaine gel which does help temporarily for an hour to relieve the itching.   She's been under a lot of stress the last year. She is currently living with her son and daughter-in-law. They are not abusive but has been a very stressful situation. She feels quite anxious about it. Her friend who is here with her today is very supportive and she feels like she can talk with her and thus a good outlet for her. She does have 3 other daughters who she speaks with on a regular occasion they just do not live here locally.  Review of Systems     BP 173/50 mmHg  Pulse 84    Allergies  Allergen Reactions  . Aspirin Swelling  . Eggs Or Egg-Derived Products   . Morphine And Related Hives  . Penicillins     REACTION: hives,throat swells    Past Medical History  Diagnosis Date  . Hypertension   . Asthma     No past surgical history on file.  Social History   Social History  .  Marital Status: Widowed    Spouse Name: N/A  . Number of Children: N/A  . Years of Education: N/A   Occupational History  . Not on file.   Social History Main Topics  . Smoking status: Never Smoker   . Smokeless tobacco: Not on file  . Alcohol Use: Yes  . Drug Use: No  . Sexual Activity: No     Comment: 1 cffeine per day, walks for exercise, lives with daughter and son-n-law, uses a walker at home.   Other Topics Concern  . Not on file   Social History Narrative    Family History  Problem Relation Age of Onset  . Heart disease Brother     Outpatient Encounter Prescriptions as of 12/15/2015  Medication Sig  . albuterol (PROVENTIL HFA;VENTOLIN HFA) 108 (90 BASE) MCG/ACT inhaler Inhale 1 puff into the lungs every 6 (six) hours as needed for wheezing or shortness of breath.  Marland Kitchen albuterol (PROVENTIL) (2.5 MG/3ML) 0.083% nebulizer solution Take 3 mLs (2.5 mg total) by nebulization every 4 (four) hours as needed for wheezing or shortness of breath.  . ALPRAZolam (XANAX) 0.5 MG tablet Take 1 tablet (0.5 mg total) by mouth 2 (two) times daily as needed for anxiety. TAKE 1 TABLET BY MOUTH  TWO TIMES DAILY PRN  . AMBULATORY NON FORMULARY MEDICATION Medication Name: Nebulize with tubing for Asthma and COPD.  Can fax to home health  . amLODipine (NORVASC) 5 MG tablet TAKE 1 TABLET (5 MG TOTAL) BY MOUTH DAILY.  . beclomethasone (QVAR) 80 MCG/ACT inhaler Inhale 2 puffs into the lungs 2 (two) times daily.  . Cyanocobalamin (VITAMIN B 12 PO) Take by mouth.  Marland Kitchen HYDROcodone-acetaminophen (NORCO/VICODIN) 5-325 MG tablet Take 1 tablet by mouth every 4 (four) hours as needed for moderate pain.  Marland Kitchen ondansetron (ZOFRAN) 4 MG tablet Take 1 tablet (4 mg total) by mouth every 8 (eight) hours as needed for nausea or vomiting.  . potassium chloride (K-DUR) 10 MEQ tablet Take 1 tablet (10 mEq total) by mouth daily.  Marland Kitchen triamcinolone cream (KENALOG) 0.1 % Apply 1 application topically 2 (two) times daily as  needed.  Marland Kitchen Umeclidinium-Vilanterol 62.5-25 MCG/INH AEPB Inhale 1 puff into the lungs daily.  . [DISCONTINUED] triamcinolone cream (KENALOG) 0.1 % Apply 1 application topically 2 (two) times daily as needed.  . sertraline (ZOLOFT) 50 MG tablet 1/2 tab po QD x 6 then increase to whole   No facility-administered encounter medications on file as of 12/15/2015.       Objective:   Physical Exam  Constitutional: She is oriented to person, place, and time. She appears well-developed and well-nourished.  HENT:  Head: Normocephalic and atraumatic.  Eyes: Conjunctivae and EOM are normal.  Cardiovascular: Normal rate.   Pulmonary/Chest: Effort normal.  Neurological: She is alert and oriented to person, place, and time.  Skin: Skin is dry. Rash noted. No pallor.  She has some bruises and excoriations on both forearms bilaterally.  Psychiatric: She has a normal mood and affect. Her behavior is normal.  Vitals reviewed.         Assessment & Plan:  Rash-I really most think this is a nervous type dermatitis. I do not see any lesions that are originating, skin. Most of what I see are excoriations and bruises. She admits that she has been under a lot of stress over the last year and particularly in the last month or 2. I did go ahead and refill the transabdominal and since it does seem to help and we also discussed putting her on something for anxiety. Next  Anxiety-she is Re: On Xanax twice a day. Discussed with her that I really didn't want to go up on that medication. In fact at some point I want to really work on weaning her down. We discussed starting an SSRI. Warned about potential side effects. We'll start her on a low-dose of sertraline and LC her back in 5 weeks to see if she's doing well. Certainly she can call me interim if she has any concerns or problems or side effects. Also offered to refer her to counseling but she declined and feels like she has good support.  Time spent 30 minutes  discussing rash, and anxiety.

## 2016-01-19 ENCOUNTER — Ambulatory Visit: Payer: Medicare Other | Admitting: Family Medicine

## 2016-02-23 ENCOUNTER — Encounter: Payer: Self-pay | Admitting: Family Medicine

## 2016-02-23 ENCOUNTER — Other Ambulatory Visit: Payer: Self-pay | Admitting: Family Medicine

## 2016-02-23 ENCOUNTER — Ambulatory Visit (INDEPENDENT_AMBULATORY_CARE_PROVIDER_SITE_OTHER): Payer: Medicare Other | Admitting: Family Medicine

## 2016-02-23 VITALS — BP 135/52 | HR 73 | Wt 160.0 lb

## 2016-02-23 DIAGNOSIS — F411 Generalized anxiety disorder: Secondary | ICD-10-CM

## 2016-02-23 DIAGNOSIS — J4489 Other specified chronic obstructive pulmonary disease: Secondary | ICD-10-CM

## 2016-02-23 DIAGNOSIS — I10 Essential (primary) hypertension: Secondary | ICD-10-CM

## 2016-02-23 DIAGNOSIS — J449 Chronic obstructive pulmonary disease, unspecified: Secondary | ICD-10-CM

## 2016-02-23 MED ORDER — HYDROCODONE-ACETAMINOPHEN 5-325 MG PO TABS: 1.0000 | ORAL_TABLET | ORAL | Status: DC | PRN

## 2016-02-23 MED ORDER — ALPRAZOLAM 0.5 MG PO TABS: 0.5000 mg | ORAL_TABLET | Freq: Two times a day (BID) | ORAL | Status: DC | PRN

## 2016-02-23 MED ORDER — SERTRALINE HCL 50 MG PO TABS: 50.0000 mg | ORAL_TABLET | Freq: Every day | ORAL | Status: DC
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
## 2016-02-24 LAB — COMPLETE METABOLIC PANEL WITH GFR
ALT: 7 U/L (ref 6–29)
AST: 12 U/L (ref 10–35)
Albumin: 3.9 g/dL (ref 3.6–5.1)
Alkaline Phosphatase: 103 U/L (ref 33–130)
BUN: 18 mg/dL (ref 7–25)
CO2: 25 mmol/L (ref 20–31)
Calcium: 9.4 mg/dL (ref 8.6–10.4)
Chloride: 104 mmol/L (ref 98–110)
Creat: 0.81 mg/dL (ref 0.60–0.88)
GFR, Est African American: 74 mL/min (ref >=60)
GFR, Est Non African American: 64 mL/min (ref >=60)
Glucose, Bld: 96 mg/dL (ref 65–99)
Potassium: 4.7 mmol/L (ref 3.5–5.3)
Sodium: 136 mmol/L (ref 135–146)
Total Bilirubin: 0.5 mg/dL (ref 0.2–1.2)
Total Protein: 6.9 g/dL (ref 6.1–8.1)

## 2016-02-24 LAB — T4, FREE: Free T4: 1.1 ng/dL (ref 0.8–1.8)

## 2016-02-24 LAB — CBC WITH DIFFERENTIAL/PLATELET
Basophils Absolute: 0.1 10*3/uL (ref 0.0–0.1)
Basophils Relative: 1 % (ref 0–1)
Eosinophils Absolute: 0.2 10*3/uL (ref 0.0–0.7)
Eosinophils Relative: 2 % (ref 0–5)
HEMATOCRIT: 38.2 % (ref 36.0–46.0)
HEMOGLOBIN: 12.7 g/dL (ref 12.0–15.0)
LYMPHS PCT: 19 % (ref 12–46)
Lymphs Abs: 1.6 10*3/uL (ref 0.7–4.0)
MCH: 31.7 pg (ref 26.0–34.0)
MCHC: 33.2 g/dL (ref 30.0–36.0)
MCV: 95.3 fL (ref 78.0–100.0)
MONO ABS: 0.6 10*3/uL (ref 0.1–1.0)
MONOS PCT: 7 % (ref 3–12)
MPV: 10.2 fL (ref 8.6–12.4)
NEUTROS ABS: 6.1 10*3/uL (ref 1.7–7.7)
Neutrophils Relative %: 71 % (ref 43–77)
Platelets: 250 10*3/uL (ref 150–400)
RBC: 4.01 MIL/uL (ref 3.87–5.11)
RDW: 13.4 % (ref 11.5–15.5)
WBC: 8.6 10*3/uL (ref 4.0–10.5)

## 2016-02-24 LAB — TSH: TSH: 6.87 mIU/L — ABNORMAL HIGH

## 2016-02-26 ENCOUNTER — Other Ambulatory Visit: Payer: Self-pay | Admitting: Family Medicine

## 2016-02-27 NOTE — Telephone Encounter (Signed)
I do not see this medication on her active med list.please advise

## 2016-04-06 ENCOUNTER — Other Ambulatory Visit: Payer: Self-pay | Admitting: Family Medicine

## 2016-04-09 ENCOUNTER — Other Ambulatory Visit: Payer: Self-pay | Admitting: Family Medicine

## 2016-05-08 ENCOUNTER — Other Ambulatory Visit: Payer: Self-pay | Admitting: *Deleted

## 2016-05-08 ENCOUNTER — Other Ambulatory Visit: Payer: Self-pay | Admitting: Family Medicine

## 2016-05-08 MED ORDER — AMLODIPINE BESYLATE 5 MG PO TABS: ORAL_TABLET | ORAL | Status: DC

## 2016-05-08 MED ORDER — ALPRAZOLAM 0.5 MG PO TABS: 0.5000 mg | ORAL_TABLET | Freq: Two times a day (BID) | ORAL | Status: DC | PRN

## 2016-05-09 ENCOUNTER — Telehealth: Payer: Self-pay | Admitting: *Deleted

## 2016-05-09 MED ORDER — ALPRAZOLAM 0.5 MG PO TABS: 0.5000 mg | ORAL_TABLET | Freq: Three times a day (TID) | ORAL | Status: DC | PRN

## 2016-05-09 NOTE — Telephone Encounter (Signed)
Spoke w/Dr. Ileene Rubens and he would be ok with giving her enough until she can p/u her monthly supply of this medication on Friday.Meredith Wilson [place] and informed pt's daughter of short supply of medication. She voiced understanding and would speak to Dr. Madilyn Fireman about this at there next appt.Meredith Wilson [place]

## 2016-05-09 NOTE — Telephone Encounter (Signed)
Pt's daughter called and stated that she is out of her xanax and she is about 2 days early to get her rx.

## 2016-06-12 ENCOUNTER — Ambulatory Visit (INDEPENDENT_AMBULATORY_CARE_PROVIDER_SITE_OTHER): Payer: Medicare Other | Admitting: Family Medicine

## 2016-06-12 ENCOUNTER — Encounter: Payer: Self-pay | Admitting: Family Medicine

## 2016-06-12 VITALS — BP 144/65 | HR 88 | Wt 159.0 lb

## 2016-06-12 DIAGNOSIS — I10 Essential (primary) hypertension: Secondary | ICD-10-CM

## 2016-06-12 DIAGNOSIS — J449 Chronic obstructive pulmonary disease, unspecified: Secondary | ICD-10-CM

## 2016-06-12 DIAGNOSIS — F411 Generalized anxiety disorder: Secondary | ICD-10-CM | POA: Diagnosis not present

## 2016-06-12 MED ORDER — AMLODIPINE BESYLATE 5 MG PO TABS: ORAL_TABLET | ORAL | Status: DC

## 2016-06-12 MED ORDER — ALPRAZOLAM 0.5 MG PO TABS: 0.5000 mg | ORAL_TABLET | Freq: Two times a day (BID) | ORAL | Status: DC | PRN

## 2016-06-12 MED ORDER — CITALOPRAM HYDROBROMIDE 20 MG PO TABS: ORAL_TABLET | ORAL | Status: DC

## 2016-06-12 MED ORDER — HYDROCODONE-ACETAMINOPHEN 5-325 MG PO TABS: 1.0000 | ORAL_TABLET | ORAL | Status: DC | PRN
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
## 2016-06-15 ENCOUNTER — Ambulatory Visit: Payer: Medicare Other | Admitting: Family Medicine

## 2016-06-22 ENCOUNTER — Ambulatory Visit: Payer: Medicare Other | Admitting: Family Medicine

## 2016-07-17 ENCOUNTER — Encounter: Payer: Self-pay | Admitting: Family Medicine

## 2016-07-17 ENCOUNTER — Ambulatory Visit (INDEPENDENT_AMBULATORY_CARE_PROVIDER_SITE_OTHER): Payer: Medicare Other | Admitting: Family Medicine

## 2016-07-17 VITALS — BP 142/68 | HR 93 | Ht 62.0 in | Wt 157.0 lb

## 2016-07-17 DIAGNOSIS — F411 Generalized anxiety disorder: Secondary | ICD-10-CM | POA: Diagnosis not present

## 2016-07-17 DIAGNOSIS — M542 Cervicalgia: Secondary | ICD-10-CM

## 2016-07-17 MED ORDER — HYDROCODONE-ACETAMINOPHEN 5-325 MG PO TABS: 1.0000 | ORAL_TABLET | ORAL | Status: DC | PRN

## 2016-07-17 MED ORDER — ALPRAZOLAM 0.5 MG PO TABS: 0.5000 mg | ORAL_TABLET | Freq: Two times a day (BID) | ORAL | Status: DC | PRN

## 2016-07-17 NOTE — Progress Notes (Signed)
Subjective:    CC: F/U anxiety   HPI: She is here today for follow-up anxiety. Her GAD 7 score was 14 when she was last year. We discussed weaning off her alprazolam. She had GI upset with Zoloft was Willing to try citalopram. He says Safar she's actually doing really well with citalopram. She's not had any negative side effects. And her daughter who is here with her today has noticed a big difference and her anxiety levels. She is still using her alprazolam regularly. In fact she is due for refill today and is asking for refill today. She is just feeling very stressed and overwhelmed. Her husband is 83 years old because of his advanced dementia is falling frequently. She is having a hard time taking care of him. It doesn't like they may have some home health coming in. She had difficulty with transportation. When he broke his hip and she had to visit him at the nursing home they were having to take a taxi which cause $30 to and from the nursing home.  Past medical history, Surgical history, Family history not pertinant except as noted below, Social history, Allergies, and medications have been entered into the medical record, reviewed, and corrections made.   Review of Systems: No fevers, chills, night sweats, weight loss, chest pain, or shortness of breath.   Objective:    General: Well Developed, well nourished, and in no acute distress.  Neuro: Alert and oriented x3, extra-ocular muscles intact, sensation grossly intact.  HEENT: Normocephalic, atraumatic  Skin: Warm and dry, no rashes. Cardiac: Regular rate and rhythm, no murmurs rubs or gallops, no lower extremity edema.  Respiratory: Clear to auscultation bilaterally. Not using accessory muscles, speaking in full sentences.   Impression and Recommendations:   GAD - dad 7 score of 11 today, decreased from 25 previously. She is tolerating the citalopram well without any side effects or problems. Continue current regimen.Hopefully she will  continue to get benefit from the medication as she is only been on it for about 2-1/2 weeks at this point. We'll continue with current dose. I did go ahead and refill her alprazolam today for 70 tabs as we had previously discussed. Continue to monitor use. Discussed with her that she might want to try using a pillbox to help remind her if she is taking her medication or not. Reminded her once again that she will have 10 extra tabs per month to take as needed on top of the twice a day dosing she is currently on. We will continue to wean down her alprazolam by 5 tabs every month for refills. 7 x-ray feel should only be 65 tabs. Follow-up in 3 months.  Cervical pain-I did go ahead and refill her hydrocodone early today that she's not to until September since I will not see her back until almost October as she does not drive and she is having difficulty getting transportation here. Also encouraged her to check into the [facility] which is here locally in the area to help seniors with transportation issues.

## 2016-07-18 ENCOUNTER — Other Ambulatory Visit: Payer: Self-pay | Admitting: *Deleted

## 2016-07-18 MED ORDER — CITALOPRAM HYDROBROMIDE 20 MG PO TABS: 20.0000 mg | ORAL_TABLET | Freq: Every day | ORAL | Status: DC

## 2016-08-17 ENCOUNTER — Other Ambulatory Visit: Payer: Self-pay | Admitting: Family Medicine

## 2016-08-20 ENCOUNTER — Other Ambulatory Visit: Payer: Self-pay | Admitting: Family Medicine

## 2016-10-18 ENCOUNTER — Ambulatory Visit (INDEPENDENT_AMBULATORY_CARE_PROVIDER_SITE_OTHER): Payer: Medicare Other | Admitting: Family Medicine

## 2016-10-18 ENCOUNTER — Encounter: Payer: Self-pay | Admitting: Family Medicine

## 2016-10-18 VITALS — BP 125/60 | HR 88 | Ht 62.0 in | Wt 159.0 lb

## 2016-10-18 DIAGNOSIS — I350 Nonrheumatic aortic (valve) stenosis: Secondary | ICD-10-CM

## 2016-10-18 DIAGNOSIS — I10 Essential (primary) hypertension: Secondary | ICD-10-CM | POA: Diagnosis not present

## 2016-10-18 DIAGNOSIS — F411 Generalized anxiety disorder: Secondary | ICD-10-CM | POA: Diagnosis not present

## 2016-10-18 DIAGNOSIS — G8929 Other chronic pain: Secondary | ICD-10-CM | POA: Diagnosis not present

## 2016-10-18 DIAGNOSIS — M542 Cervicalgia: Secondary | ICD-10-CM

## 2016-10-18 MED ORDER — ALPRAZOLAM 0.5 MG PO TABS: 0.5000 mg | ORAL_TABLET | Freq: Every day | ORAL | 0 refills | Status: DC | PRN

## 2016-10-18 MED ORDER — HYDROCODONE-ACETAMINOPHEN 5-325 MG PO TABS: 1.0000 | ORAL_TABLET | Freq: Two times a day (BID) | ORAL | 0 refills | Status: DC | PRN

## 2016-10-18 NOTE — Progress Notes (Signed)
Subjective:    CC: GAD  HPI:  Hypertension- Pt denies chest pain, SOB, dizziness, or heart palpitations.  Taking meds as directed w/o problems.  Denies medication side effects.    Chronic cervical pain - Not currently have an active pain contract or urine drug screen on file. She's currently on hydrocodone 100 tabs which usually last her at least 3 months.    GAD- Weighted been working over several months to wean down her Xanax. She was originally on 3 times a day dosing and getting 90 tabs per month. We have now gotten her down to 65 tabs per month and we'll continue to wean.  Past medical history, Surgical history, Family history not pertinant except as noted below, Social history, Allergies, and medications have been entered into the medical record, reviewed, and corrections made.   Review of Systems: No fevers, chills, night sweats, weight loss, chest pain, or shortness of breath.   Objective:    General: Well Developed, well nourished, and in no acute distress.  Neuro: Alert and oriented x3, extra-ocular muscles intact, sensation grossly intact.  HEENT: Normocephalic, atraumatic  Skin: Warm and dry, no rashes. Cardiac: Regular rate and rhythm, With 4/ 6 systolic ejection murmur best heard at the left sternal border. No lower extremity edema.  Respiratory: Clear to auscultation bilaterally. Not using accessory muscles, speaking in full sentences.   Impression and Recommendations:   HTN - Well controlled. Continue current regimen. Follow up in  3-4 months.   GAD- continue weaning the alprazolam. Will refill medication for once daily dosing she did ask for 90 day supply today because difficult for her to get here and she has to take a taxi.when she returns will continue to work on weaning dose down.    Chronic cervical pain - I am going to adjust her pain medication so reflects how she is actually taking it.  She only uses it once a day most days and occ takes a second tab.  100 tab  usually lasts her 90 days.    Aortic stenosis-plan to repeat echocardiogram in February for 2 year recheck.

## 2016-10-31 LAB — PAIN MGMT, PROFILE 6 CONF W/O MM, U
6 ACETYLMORPHINE: NEGATIVE ng/mL
Amphetamines: NEGATIVE ng/mL
BARBITURATES: NEGATIVE ng/mL
COCAINE METABOLITE: NEGATIVE ng/mL
Creatinine: 69.4 mg/dL (ref >=20.0)
METHADONE METABOLITE: NEGATIVE ng/mL
Marijuana Metabolite: NEGATIVE ng/mL
Oxidant: NEGATIVE ug/mL (ref <200)
Oxycodone: NEGATIVE ng/mL
Phencyclidine: NEGATIVE ng/mL
pH: 6.08 (ref 4.5–9.0)

## 2016-12-01 ENCOUNTER — Other Ambulatory Visit: Payer: Self-pay | Admitting: Family Medicine

## 2016-12-01 DIAGNOSIS — F411 Generalized anxiety disorder: Secondary | ICD-10-CM

## 2016-12-04 NOTE — Telephone Encounter (Signed)
Are we refilling for #90 , #60, it looks like you were beginning to wean her down back in August. I know that she has transportation issues. And due to her husband no longer living with her she makes efforts to go see him when she can which is expensive. Please advise. She has f/u on 01/22/17.Audelia Hives [place]

## 2016-12-05 NOTE — Telephone Encounter (Signed)
Okay, I will fill for 30 tabs which is a Artie day supply. She just seems to make sure that this actually last 30 days. We are going to continue to wean down her dosing.  Beatrice Lecher, MD

## 2016-12-13 ENCOUNTER — Telehealth: Payer: Self-pay | Admitting: *Deleted

## 2016-12-13 DIAGNOSIS — G250 Essential tremor: Secondary | ICD-10-CM

## 2016-12-13 DIAGNOSIS — F329 Major depressive disorder, single episode, unspecified: Secondary | ICD-10-CM

## 2016-12-13 DIAGNOSIS — M542 Cervicalgia: Secondary | ICD-10-CM

## 2016-12-13 DIAGNOSIS — F411 Generalized anxiety disorder: Secondary | ICD-10-CM

## 2016-12-13 DIAGNOSIS — F32A Depression, unspecified: Secondary | ICD-10-CM

## 2016-12-13 MED ORDER — ONDANSETRON HCL 4 MG PO TABS: ORAL_TABLET | ORAL | 1 refills | Status: DC

## 2016-12-13 MED ORDER — CITALOPRAM HYDROBROMIDE 20 MG PO TABS: 20.0000 mg | ORAL_TABLET | Freq: Every day | ORAL | 6 refills | Status: DC

## 2016-12-13 NOTE — Telephone Encounter (Signed)
Meredith Wilson w/[facility] house calls went to the pt's home to do an assessment. She stated that pt was very anxious, sad, and that her tremors were not controlled and that they were worse today than they were the last time she came out to do an assessment on her. Pt scored a 18 on her phq-9. Her vitals are stable. Pt informed her of the changes in her home such as her husband being in a LTC facility, and not being financially stable, and not having transportation.  She told her that Dr. Madilyn Fireman had cut her xanax back. She was also c/o stomach pain she said that the pain was about 7/10. She was taking something for this however, she was unsure what this was and couldn't be sure if she could afford it.   Lastly she asked if Dr. Melissa Noon would place an order for her to have home health to assist her with light housework she has a hard time especially since she has a lot of pain.   Will fwd to pcp .Meredith Wilson (medication for stomach pain was actually for nausea. Also spoke with pt and advised that she should be taking her citalopram this would help her tremendously with her nerves. She stated that she did not have a refill. This was sent to her pharmacy. Advised pt again that the xanax is to be used sparingly she stated that she told me that she did double up on her dose and this is why she ran out and requested an early refill. Which is not due until 01/18/17 called pharmacy to verify. I told her that this was supposed to last her until her f/u appt in January and could not say whether not Dr. Madilyn Fireman would bridge this until then for her. She voiced understanding.)

## 2016-12-16 ENCOUNTER — Other Ambulatory Visit: Payer: Self-pay | Admitting: Family Medicine

## 2016-12-16 NOTE — Telephone Encounter (Signed)
Her anxiety is likely increased because she is withdrawing from her xanax. Needs to take the citalopram. We can always increase her dose of citalopram after 3 weeks if needed. She can always call back.  [place] for referral for light cleaning.  See if she wants to try a new medicaiton for the tremor?

## 2016-12-16 NOTE — Progress Notes (Signed)
We can try primidone for tremor if she would like.

## 2016-12-17 NOTE — Telephone Encounter (Signed)
Left VM for home health nurse to return clinic call. Callback information provided.

## 2017-01-02 ENCOUNTER — Other Ambulatory Visit: Payer: Self-pay | Admitting: Family Medicine

## 2017-01-02 ENCOUNTER — Other Ambulatory Visit: Payer: Self-pay

## 2017-01-02 DIAGNOSIS — F411 Generalized anxiety disorder: Secondary | ICD-10-CM

## 2017-01-02 MED ORDER — ALPRAZOLAM 0.5 MG PO TABS: 0.5000 mg | ORAL_TABLET | Freq: Every day | ORAL | 0 refills | Status: DC

## 2017-01-03 DIAGNOSIS — I350 Nonrheumatic aortic (valve) stenosis: Secondary | ICD-10-CM | POA: Diagnosis not present

## 2017-01-03 DIAGNOSIS — I10 Essential (primary) hypertension: Secondary | ICD-10-CM | POA: Diagnosis not present

## 2017-01-03 DIAGNOSIS — G8929 Other chronic pain: Secondary | ICD-10-CM | POA: Diagnosis not present

## 2017-01-03 DIAGNOSIS — M542 Cervicalgia: Secondary | ICD-10-CM | POA: Diagnosis not present

## 2017-01-09 ENCOUNTER — Other Ambulatory Visit: Payer: Self-pay | Admitting: Family Medicine

## 2017-01-22 ENCOUNTER — Ambulatory Visit (INDEPENDENT_AMBULATORY_CARE_PROVIDER_SITE_OTHER): Payer: Medicare Other | Admitting: Family Medicine

## 2017-01-22 ENCOUNTER — Encounter: Payer: Self-pay | Admitting: Family Medicine

## 2017-01-22 VITALS — BP 123/63 | HR 91 | Ht 62.0 in | Wt 159.0 lb

## 2017-01-22 DIAGNOSIS — F411 Generalized anxiety disorder: Secondary | ICD-10-CM | POA: Diagnosis not present

## 2017-01-22 DIAGNOSIS — G8929 Other chronic pain: Secondary | ICD-10-CM | POA: Diagnosis not present

## 2017-01-22 DIAGNOSIS — I350 Nonrheumatic aortic (valve) stenosis: Secondary | ICD-10-CM

## 2017-01-22 DIAGNOSIS — M542 Cervicalgia: Secondary | ICD-10-CM | POA: Diagnosis not present

## 2017-01-22 MED ORDER — ALPRAZOLAM 0.5 MG PO TABS: 0.5000 mg | ORAL_TABLET | Freq: Two times a day (BID) | ORAL | 2 refills | Status: DC | PRN

## 2017-01-22 MED ORDER — HYDROCODONE-ACETAMINOPHEN 5-325 MG PO TABS: 1.0000 | ORAL_TABLET | Freq: Two times a day (BID) | ORAL | 0 refills | Status: DC | PRN

## 2017-01-22 MED ORDER — VENLAFAXINE HCL ER 37.5 MG PO CP24: 37.5000 mg | ORAL_CAPSULE | Freq: Every day | ORAL | 0 refills | Status: DC

## 2017-01-22 NOTE — Patient Instructions (Signed)
Cut the citalopram in half for one week and then stop.  Then start the effexor.

## 2017-01-23 DIAGNOSIS — G8929 Other chronic pain: Secondary | ICD-10-CM | POA: Insufficient documentation

## 2017-01-23 NOTE — Progress Notes (Signed)
Subjective:    CC: Anxiety  HPI: F/U Anxiety - she has been under a lot of stress. Her husband is in a nursing facility and she is afraid he is going to die.  She says he has lost over 50 lbs and is not eating.  She can't drive and her daughter how lives with her can't drive either.  She has to rely on a cab to visit him and to get to appointsments. She has been taking her xanx Twice a day because she's been so fearful and anxious. She says every time the phone rings she's worried that she's going to get a call that he has passed away. She says her now she would really like to be able to increase the alprazolam back to twice a day for a short period time. She says she just can't seem to get control of her anxiety. She is taking her citalopram.   Cervical pain-she is doing okay. We last saw her hydrocodone for 100 tabs 3 months ago. She doesn't take it every day but can take it twice a day if needed. She is requesting a refill today.  Aortic stenosis-she's not having any chest pain or shortness of breath.  Past medical history, Surgical history, Family history not pertinant except as noted below, Social history, Allergies, and medications have been entered into the medical record, reviewed, and corrections made.   Review of Systems: No fevers, chills, night sweats, weight loss, chest pain, or shortness of breath.   Objective:    General: Well Developed, well nourished, and in no acute distress.  Neuro: Alert and oriented x3, extra-ocular muscles intact, sensation grossly intact.  HEENT: Normocephalic, atraumatic  Skin: Warm and dry, no rashes. Cardiac: Regular rate and rhythm, no murmurs rubs or gallops, no lower extremity edema.  Respiratory: Clear to auscultation bilaterally. Not using accessory muscles, speaking in full sentences.   Impression and Recommendations:    Anxiety-I'm actually can go ahead and wean the citalopram and switch her to Effexor. We'll start with a low dose. I think  it's not as effective as it could be I would like to get her in with a therapist or counselor but unfortunately transportation is going to be an issue. She has a hard time getting here for her appointments. I did go ahead and increase her alprazolam to twice a day for the next 90 days and after that we will try to go back down on her dose.  Cervical pain/chronic pain management-did refill her hydrocodone today. We were able to obtain a urine drug screen. This is somewhat difficult with her because she is primarily wheelchair bound is able to stand for brief periods.  Aortic stenosis-due for repeat echocardiogram in February. I will go ahead and order today to schedule for a few weeks out. I'm not sure if she will be able to get to the appointment there again because of transportation issues but we will see what we can do.

## 2017-01-24 LAB — DRUG ABUSE PANEL 10-50 NO CONF, U
AMPHETAMINES (1000 ng/mL SCRN): NEGATIVE
BARBITURATES: NEGATIVE
BENZODIAZEPINES: POSITIVE — AB
COCAINE METABOLITES: NEGATIVE
MARIJUANA MET (50 ng/mL SCRN): NEGATIVE
METHADONE: NEGATIVE
METHAQUALONE: NEGATIVE
OPIATES: NEGATIVE
PHENCYCLIDINE: NEGATIVE
PROPOXYPHENE: NEGATIVE

## 2017-03-04 ENCOUNTER — Encounter: Payer: Self-pay | Admitting: Family Medicine

## 2017-03-04 ENCOUNTER — Telehealth: Payer: Self-pay | Admitting: Family Medicine

## 2017-03-04 NOTE — Telephone Encounter (Signed)
OK for letter

## 2017-03-04 NOTE — Telephone Encounter (Signed)
Letter written, Pt advised.

## 2017-03-04 NOTE — Telephone Encounter (Signed)
Pt called clinic requesting a note. Pt states her pharmacy delivers her meds to her mailbox, but she would like them delivered to her house. Since her husbands passing, it is not safe for her to walk to the road. Pt will come pick up letter when ready. Routing to PCP for review.

## 2017-04-23 ENCOUNTER — Encounter: Payer: Self-pay | Admitting: Family Medicine

## 2017-04-23 ENCOUNTER — Ambulatory Visit (INDEPENDENT_AMBULATORY_CARE_PROVIDER_SITE_OTHER): Payer: Medicare Other | Admitting: Family Medicine

## 2017-04-23 VITALS — BP 140/62 | HR 88 | Ht 62.0 in | Wt 157.0 lb

## 2017-04-23 DIAGNOSIS — G8929 Other chronic pain: Secondary | ICD-10-CM | POA: Diagnosis not present

## 2017-04-23 DIAGNOSIS — F411 Generalized anxiety disorder: Secondary | ICD-10-CM

## 2017-04-23 DIAGNOSIS — J449 Chronic obstructive pulmonary disease, unspecified: Secondary | ICD-10-CM

## 2017-04-23 DIAGNOSIS — M542 Cervicalgia: Secondary | ICD-10-CM

## 2017-04-23 DIAGNOSIS — I350 Nonrheumatic aortic (valve) stenosis: Secondary | ICD-10-CM

## 2017-04-23 MED ORDER — VENLAFAXINE HCL ER 75 MG PO CP24: 75.0000 mg | ORAL_CAPSULE | Freq: Every day | ORAL | 1 refills | Status: DC

## 2017-04-23 MED ORDER — HYDROCODONE-ACETAMINOPHEN 5-325 MG PO TABS
1.0000 | ORAL_TABLET | Freq: Two times a day (BID) | ORAL | 0 refills | Status: DC | PRN
Start: 2017-04-23 — End: 2017-07-31

## 2017-04-23 MED ORDER — TIZANIDINE HCL 2 MG PO TABS: 2.0000 mg | ORAL_TABLET | Freq: Three times a day (TID) | ORAL | 0 refills | Status: DC | PRN

## 2017-04-23 MED ORDER — ALPRAZOLAM 0.5 MG PO TABS: 0.5000 mg | ORAL_TABLET | Freq: Two times a day (BID) | ORAL | 2 refills | Status: DC | PRN

## 2017-04-23 MED ORDER — ALBUTEROL SULFATE HFA 108 (90 BASE) MCG/ACT IN AERS: 1.0000 | INHALATION_SPRAY | Freq: Four times a day (QID) | RESPIRATORY_TRACT | 2 refills | Status: AC | PRN

## 2017-04-23 MED ORDER — ALBUTEROL SULFATE (2.5 MG/3ML) 0.083% IN NEBU
2.5000 mg | INHALATION_SOLUTION | RESPIRATORY_TRACT | 0 refills | Status: DC | PRN
Start: 2017-04-23 — End: 2017-06-27

## 2017-04-23 MED ORDER — UMECLIDINIUM-VILANTEROL 62.5-25 MCG/INH IN AEPB: 1.0000 | INHALATION_SPRAY | Freq: Every day | RESPIRATORY_TRACT | 5 refills | Status: DC

## 2017-04-23 NOTE — Progress Notes (Signed)
Subjective:    Patient ID: Meredith Wilson, female    DOB: [birth year], 83 y.o.   MRN: [id number]  HPI Anxiety - Today to follow-up for anxiety. I last saw her about 3 months ago. We decided to wean the citalopram and switch her to Effexor. We set up to start with a low dose. Also discussed possibly seeing her up with a therapist or counselor but she doesn't drive and she relies on people to get her here. Her daughter lives with her also does not drive. We have worked on weaning down her alprazolam since she was last here. She was feeling extremely overwhelmed and anxious as her husband unfortunately is in a nursing home and is not doing well. She is very fearful that he is going to pass away. I increased her dose back to twice a day for the next 90 days. And at that point discuss going back down.He is been tolerating the Effexor well without any side effects or problems.  Follow-up chronic cervical pain/chronic pain management-he does take an occasional hydrocodone for her neck pain. She was given 100 tabs in July, that she could fill on September 1, and then a 100 tabs in October and 100 tabs in January. This typically lasts her 60-90 days. But she can take it up to twice a day if needed. He has been having some more pain particularly over the right trapezius muscle. She says it feels tight and sore at times. She says last time she used her hydrocodone was about 3 days ago. She said she'll usually take 1 every 2-3 days as needed.  COPD - she needs a refill on her albuterol neb vials.  She says the pollen has aggrevated her breathing.    Aortic stenosis-she's not had any lightheadedness or dizziness. She is mostly wheelchair bound though. She was not able to go for her ultrasound/echocardiogram in February.  Review of Systems  BP 140/62   Pulse 88   Ht 5\' 2"  (1.575 m)   Wt 157 lb (71.2 kg)   SpO2 96%   BMI 28.72 kg/m     Allergies  Allergen Reactions  . Amlodipine Nausea Only  . Aspirin  Swelling  . Eggs Or Egg-Derived Products   . Morphine And Related Hives  . Penicillins     REACTION: hives,throat swells  . Zoloft [Sertraline Hcl] Other (See Comments)    GI upset    Past Medical History:  Diagnosis Date  . Asthma   . Hypertension     No past surgical history on file.  Social History   Social History  . Marital status: Widowed    Spouse name: N/A  . Number of children: N/A  . Years of education: N/A   Occupational History  . Not on file.   Social History Main Topics  . Smoking status: Never Smoker  . Smokeless tobacco: Never Used  . Alcohol use Yes  . Drug use: No  . Sexual activity: No     Comment: 1 cffeine per day, walks for exercise, lives with daughter and son-n-law, uses a walker at home.   Other Topics Concern  . Not on file   Social History Narrative  . No narrative on file    Family History  Problem Relation Age of Onset  . Heart disease Brother     Outpatient Encounter Prescriptions as of 04/23/2017  Medication Sig  . albuterol (PROVENTIL HFA;VENTOLIN HFA) 108 (90 Base) MCG/ACT inhaler Inhale 1 puff into the  lungs every 6 (six) hours as needed for wheezing or shortness of breath.  Marland Kitchen albuterol (PROVENTIL) (2.5 MG/3ML) 0.083% nebulizer solution Take 3 mLs (2.5 mg total) by nebulization every 4 (four) hours as needed for wheezing or shortness of breath.  . ALPRAZolam (XANAX) 0.5 MG tablet Take 1 tablet (0.5 mg total) by mouth 2 (two) times daily as needed for anxiety.  . AMBULATORY NON FORMULARY MEDICATION Medication Name: Nebulize with tubing for Asthma and COPD.  Can fax to home health  . amLODipine (NORVASC) 5 MG tablet TAKE 1 TABLET (5 MG TOTAL) BY MOUTH DAILY.  . beclomethasone (QVAR) 80 MCG/ACT inhaler Inhale 2 puffs into the lungs 2 (two) times daily.  . furosemide (LASIX) 20 MG tablet TAKE 1 TABLET (20 MG TOTAL) BY MOUTH DAILY.  Marland Kitchen HYDROcodone-acetaminophen (NORCO/VICODIN) 5-325 MG tablet Take 1 tablet by mouth 2 (two) times  daily as needed for moderate pain.  Marland Kitchen ondansetron (ZOFRAN) 4 MG tablet TAKE 1 TABLET (4 MG TOTAL) BY MOUTH EVERY 8 (EIGHT) HOURS AS NEEDED FOR NAUSEA OR VOMITING.  . potassium chloride (K-DUR) 10 MEQ tablet Take 1 tablet (10 mEq total) by mouth daily.  Marland Kitchen tiZANidine (ZANAFLEX) 2 MG tablet Take 1 tablet (2 mg total) by mouth every 8 (eight) hours as needed for muscle spasms.  Marland Kitchen triamcinolone cream (KENALOG) 0.1 % Apply 1 application topically 2 (two) times daily as needed.  . umeclidinium-vilanterol (ANORO ELLIPTA) 62.5-25 MCG/INH AEPB Inhale 1 puff into the lungs daily.  Marland Kitchen venlafaxine XR (EFFEXOR-XR) 75 MG 24 hr capsule Take 1 capsule (75 mg total) by mouth daily.  . [DISCONTINUED] albuterol (PROVENTIL HFA;VENTOLIN HFA) 108 (90 BASE) MCG/ACT inhaler Inhale 1 puff into the lungs every 6 (six) hours as needed for wheezing or shortness of breath.  . [DISCONTINUED] albuterol (PROVENTIL) (2.5 MG/3ML) 0.083% nebulizer solution Take 3 mLs (2.5 mg total) by nebulization every 4 (four) hours as needed for wheezing or shortness of breath.  . [DISCONTINUED] ALPRAZolam (XANAX) 0.5 MG tablet Take 1 tablet (0.5 mg total) by mouth 2 (two) times daily as needed for anxiety.  . [DISCONTINUED] HYDROcodone-acetaminophen (NORCO/VICODIN) 5-325 MG tablet Take 1 tablet by mouth 2 (two) times daily as needed for moderate pain.  . [DISCONTINUED] Umeclidinium-Vilanterol 62.5-25 MCG/INH AEPB Inhale 1 puff into the lungs daily.  . [DISCONTINUED] venlafaxine XR (EFFEXOR XR) 37.5 MG 24 hr capsule Take 1 capsule (37.5 mg total) by mouth daily.   No facility-administered encounter medications on file as of 04/23/2017.          Objective:   Physical Exam  Constitutional: She is oriented to person, place, and time. She appears well-developed and well-nourished.  HENT:  Head: Normocephalic and atraumatic.  Cardiovascular: Normal rate, regular rhythm and normal heart sounds.   Pulmonary/Chest: Effort normal and breath sounds  normal.  Musculoskeletal:  Tender over the right trapezius muscles.    Neurological: She is alert and oriented to person, place, and time.  Skin: Skin is warm and dry.  Psychiatric: She has a normal mood and affect. Her behavior is normal.        Assessment & Plan:  Anxiety - Stable. Continue with alprazolam twice a day as needed but did encourage her to start trying to wean back down to half a tab if she is able to. We'll also increase her Effexor to 75 mg daily.  Chronic cervical Pain/chronic pain management -   Aortic stenosis-she was due for repeat echocardiogram in February. I went ahead and ordered the  test in January but she has not been able to go yet because of transportation issues.  Right trapezius strain-recommend heat stretching and traumas relaxer.  COPD - will refill her meds, inhaler. Lungs are clear on exam today. Return if any SOB.    She declined the Prevnar 13 vaccine but says she will think about it.

## 2017-04-26 ENCOUNTER — Other Ambulatory Visit: Payer: Self-pay | Admitting: Family Medicine

## 2017-04-26 LAB — PAIN MGMT, PROFILE 6 CONF W/O MM, U
6 ACETYLMORPHINE: NEGATIVE ng/mL (ref <10)
ALCOHOL METABOLITES: POSITIVE ng/mL — AB (ref <500)
ALPHAHYDROXYTRIAZOLAM: NEGATIVE ng/mL (ref <50)
Alphahydroxyalprazolam: 233 ng/mL — ABNORMAL HIGH (ref <25)
Alphahydroxymidazolam: NEGATIVE ng/mL (ref <50)
Aminoclonazepam: NEGATIVE ng/mL (ref <25)
Amphetamines: NEGATIVE ng/mL (ref <500)
BENZODIAZEPINES: POSITIVE ng/mL — AB (ref <100)
Barbiturates: NEGATIVE ng/mL (ref <300)
COCAINE METABOLITE: NEGATIVE ng/mL (ref <150)
CREATININE: 52.5 mg/dL (ref >=20.0)
ETHYL GLUCURONIDE (ETG): 16697 ng/mL — AB (ref <500)
ETHYL SULFATE (ETS): 5672 ng/mL — AB (ref <100)
Hydroxyethylflurazepam: NEGATIVE ng/mL (ref <50)
LORAZEPAM: NEGATIVE ng/mL (ref <50)
MARIJUANA METABOLITE: NEGATIVE ng/mL (ref <20)
Methadone Metabolite: NEGATIVE ng/mL (ref <100)
NORDIAZEPAM: NEGATIVE ng/mL (ref <50)
OXAZEPAM: NEGATIVE ng/mL (ref <50)
OXIDANT: NEGATIVE ug/mL (ref <200)
Opiates: NEGATIVE ng/mL (ref <100)
Oxycodone: NEGATIVE ng/mL (ref <100)
PLEASE NOTE: 0
Phencyclidine: NEGATIVE ng/mL (ref <25)
TEMAZEPAM: NEGATIVE ng/mL (ref <50)
pH: 6.56 (ref 4.5–9.0)

## 2017-05-08 ENCOUNTER — Ambulatory Visit (HOSPITAL_BASED_OUTPATIENT_CLINIC_OR_DEPARTMENT_OTHER): Admission: RE | Admit: 2017-05-08 | Payer: Medicare Other | Source: Ambulatory Visit

## 2017-06-27 ENCOUNTER — Other Ambulatory Visit: Payer: Self-pay | Admitting: Family Medicine

## 2017-06-27 DIAGNOSIS — J449 Chronic obstructive pulmonary disease, unspecified: Secondary | ICD-10-CM

## 2017-07-20 DIAGNOSIS — J189 Pneumonia, unspecified organism: Secondary | ICD-10-CM | POA: Insufficient documentation

## 2017-07-23 ENCOUNTER — Telehealth: Payer: Self-pay | Admitting: Family Medicine

## 2017-07-23 ENCOUNTER — Ambulatory Visit: Payer: Medicare Other | Admitting: Family Medicine

## 2017-07-23 NOTE — Telephone Encounter (Signed)
Dr Jerilynn Mages: Juluis Rainier: Pt's daughter, Bonnita Nasuti called. She states Mom was admitted to [facility] in [place] on Friday for low hemoglobin and is still in hospital.  Thank you.

## 2017-07-24 NOTE — Telephone Encounter (Signed)
Did Meredith Wilson leave a phone number? I don't see her phone number listed under the demographics.

## 2017-07-29 NOTE — Telephone Encounter (Signed)
Sorry, she did not leave her number and I did not think to ask.

## 2017-07-29 NOTE — Telephone Encounter (Signed)
I called Meredith Wilson's house this morning and her her daughter Meredith Wilson answered(ph #[phone]) Meredith Wilson was discharged from hospital on Friday 07/27 she was in for Double Pnuemonia and also had to get a blood transfusion. She is on the schedule for hospital f/u with [facility] on 8/01.

## 2017-07-31 ENCOUNTER — Encounter: Payer: Self-pay | Admitting: Physician Assistant

## 2017-07-31 ENCOUNTER — Ambulatory Visit (INDEPENDENT_AMBULATORY_CARE_PROVIDER_SITE_OTHER): Payer: Medicare Other | Admitting: Physician Assistant

## 2017-07-31 VITALS — BP 129/59 | HR 76 | Temp 97.5°F | Ht 63.0 in

## 2017-07-31 DIAGNOSIS — Z09 Encounter for follow-up examination after completed treatment for conditions other than malignant neoplasm: Secondary | ICD-10-CM

## 2017-07-31 DIAGNOSIS — J181 Lobar pneumonia, unspecified organism: Secondary | ICD-10-CM | POA: Diagnosis not present

## 2017-07-31 DIAGNOSIS — K1379 Other lesions of oral mucosa: Secondary | ICD-10-CM | POA: Diagnosis not present

## 2017-07-31 DIAGNOSIS — J189 Pneumonia, unspecified organism: Secondary | ICD-10-CM

## 2017-07-31 DIAGNOSIS — D509 Iron deficiency anemia, unspecified: Secondary | ICD-10-CM

## 2017-07-31 DIAGNOSIS — F411 Generalized anxiety disorder: Secondary | ICD-10-CM | POA: Diagnosis not present

## 2017-07-31 LAB — CBC WITH DIFFERENTIAL/PLATELET
BASOS ABS: 0 {cells}/uL (ref 0–200)
Basophils Relative: 0 %
EOS PCT: 0 %
Eosinophils Absolute: 0 cells/uL — ABNORMAL LOW (ref 15–500)
HCT: 32.6 % — ABNORMAL LOW (ref 35.0–45.0)
Hemoglobin: 9.8 g/dL — ABNORMAL LOW (ref 11.7–15.5)
Lymphocytes Relative: 6 %
Lymphs Abs: 966 cells/uL (ref 850–3900)
MCH: 24.6 pg — AB (ref 27.0–33.0)
MCHC: 30.1 g/dL — AB (ref 32.0–36.0)
MCV: 81.9 fL (ref 80.0–100.0)
MONOS PCT: 5 %
MPV: 9.9 fL (ref 7.5–12.5)
Monocytes Absolute: 805 cells/uL (ref 200–950)
NEUTROS PCT: 89 %
Neutro Abs: 14329 cells/uL — ABNORMAL HIGH (ref 1500–7800)
PLATELETS: 297 10*3/uL (ref 140–400)
RBC: 3.98 MIL/uL (ref 3.80–5.10)
RDW: 18.8 % — ABNORMAL HIGH (ref 11.0–15.0)
WBC: 16.1 10*3/uL — ABNORMAL HIGH (ref 3.8–10.8)

## 2017-07-31 MED ORDER — LIDOCAINE VISCOUS 2 % MT SOLN: 20.0000 mL | OROMUCOSAL | 0 refills | Status: AC | PRN

## 2017-07-31 MED ORDER — ALPRAZOLAM 0.5 MG PO TABS: 0.5000 mg | ORAL_TABLET | Freq: Two times a day (BID) | ORAL | 0 refills | Status: DC | PRN

## 2017-07-31 NOTE — Patient Instructions (Addendum)
-   Aquafor for lips  - Lidocaine mouth wash, swish and spit as needed for mouth pain  - Finish antibiotics  - Follow-up with Dr. Joellyn Quails in 1 week

## 2017-07-31 NOTE — Progress Notes (Addendum)
HPI:                                                                Meredith Wilson is a 83 y.o. female who presents to [facility] today for hospital follow-up  Patient with PMH HTN, asthma, COPD, anxiety, essential tremor, and colon cancer s/p colectomy recently discharged from [facility] telemetry unit after 5 days LOS for acute respiratory failure 2/2 multilobar pneumonia.   Hospital Course: Patient presented to [facility] emergency department via EMS with shortness of breath and upper abdominal pain. On arrival, patient was hypoxic on RA w/SpO2 of 84%. CBC revealed a microcytic anemia (Hgb 7.7, MCV 81), a significant decrease from baseline Hgb of 13. She also underwent a CT angiogram for mildly elevated D-dimer. CT angiogram was negative for PE or dissection, but did show a multilobar PNA. She was admitted to the hospitalist service 07/19/17-07/23/17.  Patient was admitted to telemetry floor and treated for the following: 1) Acute respiratory failure with hypoxia secondary to PNA, possibly secondary to gram positive bacteria and COPD exacerbation- treated with rocephin/azithro, steroids, and ATC nebs. She will be transitioned to [facility] and doxy at discharge. Patient improved but still had oxygen requirement. She will be qualified for home oxygen at time of discharge.   2) Microcytic anemia , h/o colon CA and heme + stool-s/p transfusion 2U PRBC on 7/21. She underwent EGD on 7/25 which showed distal esophageal stricture (dilated using a 8F [place]), small hiatal hernia, but no source of bleeding. In the absence of active bleeding, no further intervention is planned, but if she continues to display evidence of bleeding, we would need to plan for colonoscopy, preferentially when her respiratory status improves further. Hb 9.1 on day of discharge.    3) Anxiety, Essential hypertension-stable. Continue home meds.  4) dorsal foot erythema  possibly secondary to cellulitis-continue course of doxy as outpatient.   Per hospitalist note, referral was placed to [facility]  Patient also discharged on home oxygen  She requires a repeat CBC today to monitor Hgb.   Past Medical History:  Diagnosis Date  . Aortic stenosis 01/27/2014   Echo 12/2013: mild.  Done at [facility]   . Asthma   . Colon cancer ([place]) 09/04/2011  . Depression 09/04/2011  . Essential tremor 07/22/2014  . Heart murmur 07/22/2014  . Hypertension    No past surgical history on file. Social History  Substance Use Topics  . Smoking status: Never Smoker  . Smokeless tobacco: Never Used  . Alcohol use Yes   family history includes Heart disease in her brother.  ROS: Review of Systems  Constitutional: Negative for chills, diaphoresis and fever.  HENT:       Mouth pain  Respiratory: Positive for shortness of breath. Negative for cough, hemoptysis, sputum production and wheezing.   Cardiovascular: Negative for chest pain, palpitations, orthopnea and leg swelling.  Gastrointestinal: Negative.   Genitourinary: Negative.   Musculoskeletal: Negative.   Skin: Negative for rash.  Neurological: Positive for tremors and weakness. Negative for dizziness, focal weakness, loss of consciousness and headaches.     Medications: Current Outpatient Prescriptions  Medication Sig Dispense Refill  . albuterol (PROVENTIL HFA;VENTOLIN HFA) 108 (90  Base) MCG/ACT inhaler Inhale 1 puff into the lungs every 6 (six) hours as needed for wheezing or shortness of breath. 1 Inhaler 2  . albuterol (PROVENTIL) (2.5 MG/3ML) 0.083% nebulizer solution USE 1 VIAL IN NEBULIZER EVERY 4 HOURS AS NEEDED 75 mL 0  . ALPRAZolam (XANAX) 0.5 MG tablet Take 1 tablet (0.5 mg total) by mouth 2 (two) times daily as needed for anxiety. 60 tablet 2  . AMBULATORY NON FORMULARY MEDICATION Medication Name: Nebulize with tubing for Asthma and COPD.  Can fax to home health 1 Units 0  .  methylPREDNISolone (MEDROL DOSEPAK) 4 MG TBPK tablet Take by mouth.    . sodium chloride (OCEAN) 0.65 % nasal spray 0.1 mLs (1 spray total) by Both Nostrils route as needed for Congestion.    Marland Kitchen umeclidinium-vilanterol (ANORO ELLIPTA) 62.5-25 MCG/INH AEPB Inhale 1 puff into the lungs daily. 60 each 5  . amLODipine (NORVASC) 5 MG tablet TAKE 1 TABLET (5 MG TOTAL) BY MOUTH DAILY. (Patient not taking: Reported on 07/31/2017) 30 tablet 6  . beclomethasone (QVAR) 80 MCG/ACT inhaler Inhale 2 puffs into the lungs 2 (two) times daily. (Patient not taking: Reported on 07/31/2017) 1 Inhaler 5  . cefdinir (OMNICEF) 300 MG capsule Take by mouth.    . doxycycline (DORYX) 100 MG EC tablet Take by mouth.    . furosemide (LASIX) 20 MG tablet TAKE 1 TABLET (20 MG TOTAL) BY MOUTH DAILY. (Patient not taking: Reported on 07/31/2017) 30 tablet 4  . HYDROcodone-acetaminophen (NORCO/VICODIN) 5-325 MG tablet Take 1 tablet by mouth 2 (two) times daily as needed for moderate pain. (Patient not taking: Reported on 07/31/2017) 100 tablet 0  . ondansetron (ZOFRAN) 4 MG tablet TAKE 1 TABLET (4 MG TOTAL) BY MOUTH EVERY 8 (EIGHT) HOURS AS NEEDED FOR NAUSEA OR VOMITING. (Patient not taking: Reported on 07/31/2017) 20 tablet 1  . potassium chloride (K-DUR) 10 MEQ tablet Take 1 tablet (10 mEq total) by mouth daily. (Patient not taking: Reported on 07/31/2017) 90 tablet 1  . tiZANidine (ZANAFLEX) 2 MG tablet Take 1 tablet (2 mg total) by mouth every 8 (eight) hours as needed for muscle spasms. (Patient not taking: Reported on 07/31/2017) 20 tablet 0  . triamcinolone cream (KENALOG) 0.1 % Apply 1 application topically 2 (two) times daily as needed. (Patient not taking: Reported on 07/31/2017) 45 g 1  . venlafaxine XR (EFFEXOR-XR) 75 MG 24 hr capsule Take 1 capsule (75 mg total) by mouth daily. (Patient not taking: Reported on 07/31/2017) 90 capsule 1   No current facility-administered medications for this visit.    Allergies  Allergen Reactions  .  Amlodipine Nausea Only  . Aspirin Swelling  . Eggs Or Egg-Derived Products   . Morphine And Related Hives  . Penicillins     REACTION: hives,throat swells  . Zoloft [Sertraline Hcl] Other (See Comments)    GI upset       Objective:  BP (!) 129/59   Pulse 76   Temp (!) 97.5 F (36.4 C)   Ht 5\' 3"  (1.6 m)   SpO2 100%  Gen: elderly female sitting in wheelchair, not ill-appearing, no distress HEENT: normal conjunctiva, wearing glasses, nasal cannula in place, lips dry and cracking, oroharynx clear, moist mucus membranes without lesions, trachea midline Pulm: Normal work of breathing, normal phonation, clear to auscultation bilaterally, no wheezes, rales or rhonchi CV: Normal rate, regular rhythm, s1 and s2 distinct, grade II/VI systolic ejection murmur heard best at the RUSB, no clicks or rubs  GI: abdomen with left-sided ostomy containing light brown stool, abdomen soft, nondistended, nontender Neuro: alert and oriented x 3, EOM's intact, resting tremor of head/neck MSK: extremities atraumatic, normal gait and station, no peripheral edema Skin: warm, dry, intact; no rashes on exposed skin, no cyanosis  No results found for this or any previous visit (from the past 72 hour(s)). No results found.    Assessment and Plan: 83 y.o. female with   1. Hospital discharge follow-up - reviewed hospital A&P and discharge plan. CBC ordered  2. Microcytic anemia - CBC with Differential/Platelet  3. Pneumonia of both lower lobes due to infectious organism [facility]) - continue Cefdinir and Doxycycline - close follow-up in 1 week with PCP  4. GAD (generalized anxiety disorder) - ALPRAZolam (XANAX) 0.5 MG tablet; Take 1 tablet (0.5 mg total) by mouth 2 (two) times daily as needed for anxiety.  Dispense: 30 tablet; Refill: 0  5. Acute pain of mouth - no evidence of candidiasis or lesions. Symptomatic management - lidocaine (XYLOCAINE) 2 % solution; Use as directed 20 mLs in the mouth or  throat as needed for mouth pain.  Dispense: 100 mL; Refill: 0   Patient education and anticipatory guidance given Patient agrees with treatment plan Follow-up in 1 week or sooner as needed if symptoms worsen or fail to improve  Darlyne Russian PA-C

## 2017-08-01 ENCOUNTER — Other Ambulatory Visit: Payer: Self-pay | Admitting: Physician Assistant

## 2017-08-01 ENCOUNTER — Encounter: Payer: Self-pay | Admitting: Physician Assistant

## 2017-08-01 DIAGNOSIS — F411 Generalized anxiety disorder: Secondary | ICD-10-CM

## 2017-08-01 DIAGNOSIS — D72829 Elevated white blood cell count, unspecified: Secondary | ICD-10-CM | POA: Insufficient documentation

## 2017-08-01 DIAGNOSIS — D509 Iron deficiency anemia, unspecified: Secondary | ICD-10-CM | POA: Insufficient documentation

## 2017-08-01 NOTE — Progress Notes (Signed)
Hemoglobin is coming back up You do have a white blood cell count that was not present at discharge This could be due to the steroids you were given or it could be a sign of infection If you develop fever, chills, worsening cough/SOB please return sooner Otherwise please keep follow-up appt with Dr. Joellyn Quails next week

## 2017-08-08 ENCOUNTER — Ambulatory Visit (INDEPENDENT_AMBULATORY_CARE_PROVIDER_SITE_OTHER): Payer: Medicare Other | Admitting: Family Medicine

## 2017-08-08 VITALS — BP 125/97 | HR 86 | Temp 97.9°F | Wt 153.0 lb

## 2017-08-08 DIAGNOSIS — D72829 Elevated white blood cell count, unspecified: Secondary | ICD-10-CM

## 2017-08-08 DIAGNOSIS — J181 Lobar pneumonia, unspecified organism: Secondary | ICD-10-CM

## 2017-08-08 DIAGNOSIS — M542 Cervicalgia: Secondary | ICD-10-CM | POA: Diagnosis not present

## 2017-08-08 DIAGNOSIS — K1379 Other lesions of oral mucosa: Secondary | ICD-10-CM | POA: Insufficient documentation

## 2017-08-08 DIAGNOSIS — J189 Pneumonia, unspecified organism: Secondary | ICD-10-CM

## 2017-08-08 DIAGNOSIS — D509 Iron deficiency anemia, unspecified: Secondary | ICD-10-CM

## 2017-08-08 DIAGNOSIS — G8929 Other chronic pain: Secondary | ICD-10-CM

## 2017-08-08 DIAGNOSIS — J449 Chronic obstructive pulmonary disease, unspecified: Secondary | ICD-10-CM | POA: Diagnosis not present

## 2017-08-08 DIAGNOSIS — D649 Anemia, unspecified: Secondary | ICD-10-CM

## 2017-08-08 LAB — CBC WITH DIFFERENTIAL/PLATELET
Basophils Absolute: 0 cells/uL (ref 0–200)
Basophils Relative: 0 %
EOS ABS: 87 {cells}/uL (ref 15–500)
Eosinophils Relative: 1 %
HEMATOCRIT: 34 % — AB (ref 35.0–45.0)
HEMOGLOBIN: 10.7 g/dL — AB (ref 11.7–15.5)
LYMPHS ABS: 1566 {cells}/uL (ref 850–3900)
Lymphocytes Relative: 18 %
MCH: 25.7 pg — ABNORMAL LOW (ref 27.0–33.0)
MCHC: 31.5 g/dL — ABNORMAL LOW (ref 32.0–36.0)
MCV: 81.5 fL (ref 80.0–100.0)
MONO ABS: 522 {cells}/uL (ref 200–950)
MPV: 9.7 fL (ref 7.5–12.5)
Monocytes Relative: 6 %
NEUTROS ABS: 6525 {cells}/uL (ref 1500–7800)
Neutrophils Relative %: 75 %
PLATELETS: 300 10*3/uL (ref 140–400)
RBC: 4.17 MIL/uL (ref 3.80–5.10)
RDW: 20 % — ABNORMAL HIGH (ref 11.0–15.0)
WBC: 8.7 10*3/uL (ref 3.8–10.8)

## 2017-08-08 MED ORDER — HYDROCODONE-ACETAMINOPHEN 5-325 MG PO TABS
1.0000 | ORAL_TABLET | Freq: Two times a day (BID) | ORAL | 0 refills | Status: DC | PRN
Start: 2017-08-08 — End: 2017-11-12

## 2017-08-08 NOTE — Progress Notes (Signed)
Subjective:    Patient ID: Meredith Wilson, female    DOB: 03-Nov-1923, 83 y.o.   MRN: [id number]  HPI Follow-up bilateral lower lobe pneumonia. She was treated with cefdinir and doxycycline. She was seen for hospital follow-up about a week ago. She reports that she is doing well and has completed all her antibiotics.She was discharged home on oxygen and has been wearing it consistently. She feels like she is 80% better and says she feels like she is almost over this.  Cervical neck pain/chronic pain management - She did request a refill on her hydrocodone today. She is due.  ED-using her inhalers daily.  She was also noted to have microcytic anemia with a prior history of colon cancer and heme positive stools. In fact she was actually transfused 2 units of red blood cells. She underwent EGD on July 25 which showed an esophageal stricture which they went ahead and dilated. Was no particular source of bleeding found. They recommended consideration of colonoscopy.   Review of Systems   BP (!) 125/97   Pulse 86   Temp 97.9 F (36.6 C) (Oral)   Wt 153 lb (69.4 kg)   SpO2 100%   BMI 27.10 kg/m     Allergies  Allergen Reactions  . Amlodipine Nausea Only  . Aspirin Swelling  . Eggs Or Egg-Derived Products   . Morphine And Related Hives  . Penicillins     REACTION: hives,throat swells  . Zoloft [Sertraline Hcl] Other (See Comments)    GI upset    Past Medical History:  Diagnosis Date  . Aortic stenosis 01/27/2014   Echo 12/2013: mild.  Done at [facility]   . Asthma   . Colon cancer ([place]) 09/04/2011  . Depression 09/04/2011  . Essential tremor 07/22/2014  . Heart murmur 07/22/2014  . Hypertension     No past surgical history on file.  Social History   Social History  . Marital status: Widowed    Spouse name: N/A  . Number of children: N/A  . Years of education: N/A   Occupational History  . Not on file.   Social History Main Topics  . Smoking status: Never Smoker  .  Smokeless tobacco: Never Used  . Alcohol use Yes  . Drug use: No  . Sexual activity: No     Comment: 1 cffeine per day, walks for exercise, lives with daughter and son-n-law, uses a walker at home.   Other Topics Concern  . Not on file   Social History Narrative  . No narrative on file    Family History  Problem Relation Age of Onset  . Heart disease Brother     Outpatient Encounter Prescriptions as of 08/08/2017  Medication Sig  . albuterol (PROVENTIL HFA;VENTOLIN HFA) 108 (90 Base) MCG/ACT inhaler Inhale 1 puff into the lungs every 6 (six) hours as needed for wheezing or shortness of breath.  Marland Kitchen albuterol (PROVENTIL) (2.5 MG/3ML) 0.083% nebulizer solution USE 1 VIAL IN NEBULIZER EVERY 4 HOURS AS NEEDED  . ALPRAZolam (XANAX) 0.5 MG tablet Take 1 tablet (0.5 mg total) by mouth 2 (two) times daily as needed for anxiety.  . AMBULATORY NON FORMULARY MEDICATION Medication Name: Nebulize with tubing for Asthma and COPD.  Can fax to home health  . lidocaine (XYLOCAINE) 2 % solution Use as directed 20 mLs in the mouth or throat as needed for mouth pain.  . sodium chloride (OCEAN) 0.65 % nasal spray 0.1 mLs (1 spray total) by Both Nostrils  route as needed for Congestion.  Marland Kitchen umeclidinium-vilanterol (ANORO ELLIPTA) 62.5-25 MCG/INH AEPB Inhale 1 puff into the lungs daily.  Marland Kitchen HYDROcodone-acetaminophen (NORCO/VICODIN) 5-325 MG tablet Take 1 tablet by mouth 2 (two) times daily as needed for moderate pain.   No facility-administered encounter medications on file as of 08/08/2017.           Objective:   Physical Exam  Constitutional: She is oriented to person, place, and time. She appears well-developed and well-nourished.  HENT:  Head: Normocephalic and atraumatic.  Cardiovascular: Normal rate, regular rhythm and normal heart sounds.   Pulmonary/Chest: Effort normal and breath sounds normal.  Neurological: She is alert and oriented to person, place, and time.  Skin: Skin is warm and dry.   Psychiatric: She has a normal mood and affect. Her behavior is normal.       Assessment & Plan:  Bilat Lower lobe PNA - She feels that she is 80% improved today. She has completed antibiotics yesterday. I would like to repeat her white blood cell count since it was still significantly elevated a week ago after discharge from hospital. She will go today for that.  COPD-continue with Anoro ellipta.  We did try to remove her oxygen today to see if she can be discontinued from it. She was not previously on oxygen before her hospitalization. Sitting at rest she dropped her saturations to about 94% and then with ambulation down the hall she dropped under 88%. She is very immobile and really only gets up and walks to her kitchen in her own home in the bathroom typically with a walker. So we were I'm not able to complete a 6 minute walk test. She really only walked about 30 feet. In one month and try to see if she is able to wean off the oxygen at that point in time.  Cervical neck pain/chronic pain management - Did check the [facility]. Prescribe and appears to be adequate. The go ahead and refill her medications today.  Microcytic anemia-we'll recheck her hemoglobin and see if it looks like it is starting to come up. There was no source of bleeding found that she really needs further workup including a colonoscopy. But at age 25 she would be very high risk for the procedure and if her hemoglobin stabilizes it may not be worth doing the exam. Assess further at follow-up visit. Will go ahead and recheck a hemoglobin as well.

## 2017-08-12 ENCOUNTER — Telehealth: Payer: Self-pay

## 2017-08-12 NOTE — Telephone Encounter (Signed)
Blood pressure here looks good today she's been getting some low blood pressures at home and please let me know exactly what they are. In regards to the oxygen she does need to continue it. We actually tried to mobilize her here in the office and unfortunately her oxygen dropped without it so I encouraged her to stay on it for at least another 3-4 weeks and then at that point we will reassess her in the office to see if she will be able to come off of it.

## 2017-08-12 NOTE — Telephone Encounter (Signed)
Pt's daughter is asking if she should continue her BP med since her BP has been lower, and also if she should continue on her O2 at 2L.  Please advise.

## 2017-08-13 NOTE — Telephone Encounter (Signed)
Attempted to contact daughter, no answer and no VM.

## 2017-08-21 ENCOUNTER — Telehealth: Payer: Self-pay | Admitting: *Deleted

## 2017-08-21 NOTE — Telephone Encounter (Signed)
Forms completed, faxed, copied and scanned, confirmation received.Maryruth Eve, Lahoma Crocker

## 2017-09-06 ENCOUNTER — Other Ambulatory Visit: Payer: Self-pay | Admitting: Family Medicine

## 2017-09-06 DIAGNOSIS — J449 Chronic obstructive pulmonary disease, unspecified: Secondary | ICD-10-CM

## 2017-09-09 ENCOUNTER — Ambulatory Visit: Payer: Medicare Other | Admitting: Family Medicine

## 2017-09-09 ENCOUNTER — Other Ambulatory Visit: Payer: Self-pay | Admitting: Family Medicine

## 2017-09-09 DIAGNOSIS — F411 Generalized anxiety disorder: Secondary | ICD-10-CM

## 2017-09-09 DIAGNOSIS — Z0189 Encounter for other specified special examinations: Secondary | ICD-10-CM

## 2017-09-10 ENCOUNTER — Other Ambulatory Visit: Payer: Self-pay | Admitting: *Deleted

## 2017-09-10 ENCOUNTER — Other Ambulatory Visit: Payer: Self-pay | Admitting: Family Medicine

## 2017-09-10 DIAGNOSIS — F411 Generalized anxiety disorder: Secondary | ICD-10-CM

## 2017-09-10 MED ORDER — ALPRAZOLAM 0.5 MG PO TABS: 0.2500 mg | ORAL_TABLET | Freq: Two times a day (BID) | ORAL | 1 refills | Status: DC | PRN

## 2017-09-16 ENCOUNTER — Other Ambulatory Visit: Payer: Self-pay | Admitting: Family Medicine

## 2017-09-16 ENCOUNTER — Other Ambulatory Visit: Payer: Self-pay | Admitting: *Deleted

## 2017-09-16 DIAGNOSIS — J449 Chronic obstructive pulmonary disease, unspecified: Secondary | ICD-10-CM

## 2017-09-16 MED ORDER — ALBUTEROL SULFATE (2.5 MG/3ML) 0.083% IN NEBU: INHALATION_SOLUTION | RESPIRATORY_TRACT | 2 refills | Status: AC

## 2017-09-26 ENCOUNTER — Encounter: Payer: Self-pay | Admitting: Family Medicine

## 2017-10-02 ENCOUNTER — Telehealth: Payer: Self-pay

## 2017-10-02 NOTE — Telephone Encounter (Signed)
Tammy from [employer] called and would like an Rx for a Medical Social Worker to evaluate patient's needs and set patient up with services.     Fax to [phone]

## 2017-10-03 NOTE — Telephone Encounter (Signed)
OK for Education officer, museum. Can they just take verbal and then they can fax order to sign?

## 2017-10-03 NOTE — Telephone Encounter (Signed)
Left message on machine advising ok for Medical social worker.

## 2017-10-04 DIAGNOSIS — J441 Chronic obstructive pulmonary disease with (acute) exacerbation: Secondary | ICD-10-CM | POA: Diagnosis not present

## 2017-10-04 DIAGNOSIS — J440 Chronic obstructive pulmonary disease with acute lower respiratory infection: Secondary | ICD-10-CM | POA: Diagnosis not present

## 2017-10-04 DIAGNOSIS — J189 Pneumonia, unspecified organism: Secondary | ICD-10-CM | POA: Diagnosis not present

## 2017-10-04 DIAGNOSIS — J9621 Acute and chronic respiratory failure with hypoxia: Secondary | ICD-10-CM | POA: Diagnosis not present

## 2017-10-18 ENCOUNTER — Telehealth: Payer: Self-pay

## 2017-10-18 NOTE — Telephone Encounter (Signed)
Physical therapist Denny Peon) called and said that patient is having a hard time with the decreased dose of xanax.  Vitals seem stable per PT  Afebrile, bp in the 130's over 80's, O2 sat 99.  Skin is clammy and cool, and pt is having tremors.  Pt feels that she is going through withdrawal due to the decreased dose.  Pt's daughter instructed to call EMS if pt becomes worse.  She is going to make an appointment with you next week to discuss decrease in dosage.

## 2017-10-18 NOTE — Telephone Encounter (Signed)
I am confused. She has been on the same dose since 07/31/2017. It is a half a tab twice a day.  She shouldn't be withdrawing 3 months latera.  There may be something else going on with the patient.  But we may also need to verify how she is taking her medication.

## 2017-10-21 ENCOUNTER — Telehealth: Payer: Self-pay | Admitting: Family Medicine

## 2017-10-21 NOTE — Telephone Encounter (Signed)
Received call from [facility] with [facility] stating the Pt needs a refill on her xanax.  Called [facility], Pt picked up last Rx on 10/07/17 for quantity 30 tabs (one month supply).  Called Pt, she states she takes "half a pill in the morning, and half a pill at night." This is correct and the Rx should last a month. Pt states "well I'm out." Went over directions again, still correct. Advised Pt she needs to look at the bottle to see how many are left and call us back. Pt then questioned why her supply went from 60 tablets to 30 tablets. Informed Pt there is no need for 60 tablets based on how she is supposed to be taking the medication. She will look at her bottle and contact clinic.

## 2017-10-22 NOTE — Telephone Encounter (Signed)
Meredith Wilson has been advised.

## 2017-10-22 NOTE — Telephone Encounter (Signed)
Patient will have to wait until her next refill is due.  I am not going to fill early.  This is been a recurring problem over the last several years.  You may want to let the nurse from [facility] know that I have actually been very suspicious of the daughter who currently lives with her.  It seems like her medications grimacing frequently and the daughter has in the past become irritable and irate during office visits when I did not increase or adjust her benzodiazepine.

## 2017-10-22 NOTE — Telephone Encounter (Signed)
Addressed in separate phone note.

## 2017-11-08 ENCOUNTER — Ambulatory Visit: Payer: Medicare Other | Admitting: Family Medicine

## 2017-11-08 ENCOUNTER — Other Ambulatory Visit: Payer: Self-pay | Admitting: Family Medicine

## 2017-11-08 ENCOUNTER — Telehealth: Payer: Self-pay | Admitting: Family Medicine

## 2017-11-08 DIAGNOSIS — F411 Generalized anxiety disorder: Secondary | ICD-10-CM

## 2017-11-08 MED ORDER — ALPRAZOLAM 0.5 MG PO TABS: 0.2500 mg | ORAL_TABLET | Freq: Two times a day (BID) | ORAL | 0 refills | Status: DC | PRN

## 2017-11-08 NOTE — Telephone Encounter (Signed)
Ok to refill for 1 month but she is due for an appointment. She was supposed to f/u next week.

## 2017-11-08 NOTE — Telephone Encounter (Signed)
Pt's daugher, Mardene Celeste called. Ms. Boies is needing a refill on her xanax. Thank you.

## 2017-11-08 NOTE — Telephone Encounter (Signed)
Patient is scheduled on 11/12/17 at 3:00 with Dr. Ronney Asters

## 2017-11-08 NOTE — Telephone Encounter (Signed)
Pt advised and transferred to the scheduler for appt.

## 2017-11-08 NOTE — Telephone Encounter (Signed)
Pended Rx and routed to PCP for approval.

## 2017-11-12 ENCOUNTER — Ambulatory Visit (INDEPENDENT_AMBULATORY_CARE_PROVIDER_SITE_OTHER): Payer: Medicare Other | Admitting: Family Medicine

## 2017-11-12 ENCOUNTER — Encounter: Payer: Self-pay | Admitting: Family Medicine

## 2017-11-12 ENCOUNTER — Ambulatory Visit: Payer: Medicare Other

## 2017-11-12 ENCOUNTER — Ambulatory Visit (INDEPENDENT_AMBULATORY_CARE_PROVIDER_SITE_OTHER): Payer: Medicare Other

## 2017-11-12 VITALS — BP 170/64 | HR 101 | Wt 142.0 lb

## 2017-11-12 DIAGNOSIS — G250 Essential tremor: Secondary | ICD-10-CM | POA: Diagnosis not present

## 2017-11-12 DIAGNOSIS — I10 Essential (primary) hypertension: Secondary | ICD-10-CM

## 2017-11-12 DIAGNOSIS — G8929 Other chronic pain: Secondary | ICD-10-CM | POA: Diagnosis not present

## 2017-11-12 DIAGNOSIS — J449 Chronic obstructive pulmonary disease, unspecified: Secondary | ICD-10-CM | POA: Diagnosis not present

## 2017-11-12 DIAGNOSIS — M542 Cervicalgia: Secondary | ICD-10-CM

## 2017-11-12 DIAGNOSIS — M4802 Spinal stenosis, cervical region: Secondary | ICD-10-CM

## 2017-11-12 DIAGNOSIS — F411 Generalized anxiety disorder: Secondary | ICD-10-CM

## 2017-11-12 MED ORDER — PROPRANOLOL HCL ER 60 MG PO CP24: 60.0000 mg | ORAL_CAPSULE | Freq: Every day | ORAL | 0 refills | Status: DC

## 2017-11-12 MED ORDER — HYDROCODONE-ACETAMINOPHEN 5-325 MG PO TABS: 1.0000 | ORAL_TABLET | Freq: Two times a day (BID) | ORAL | 0 refills | Status: DC | PRN

## 2017-11-12 NOTE — Patient Instructions (Addendum)
Recommend a trial of claritin for the postnasal drip.  If this does not clear up your cough over the next week or 2 then please let me know. Start propranolol for your blood pressure and for your tremor.  We can always adjust the dose.  If you are able to check her blood pressure at home please do that and call me in a couple of weeks. Call us back if your cough is not better or if you feel like it is getting worse or moving into your chest.

## 2017-11-12 NOTE — Progress Notes (Addendum)
Subjective:    CC: Anxiety  HPI:  Hypertension- Pt denies chest pain, SOB, dizziness, or heart palpitations.  Taking meds as directed w/o problems.  Denies medication side effects.    Anxiety -she really would like to increase her alprazolam if possible.  She says coming down on the medication has worsened her tremor.  She not really using it for anxiety as much as she is just using it for her tremor.  COPD -overall she is been doing well but says she has had a cough that, is coming from her throat area for the last couple weeks.  It is mostly dry and she will notice an occasional wheeze but she really has not felt short of breath.  No sore throat or ear pain.  No fever chills or sweats and no phlegm production.  Chronic pain management-she would like a refill on her pain medication for her neck.  We last filled the medicine in August for 100 tabs and that has lasted her.  She is still continuing to have pain in her neck and wonders if she might benefit from physical therapy.  Past medical history, Surgical history, Family history not pertinant except as noted below, Social history, Allergies, and medications have been entered into the medical record, reviewed, and corrections made.   Review of Systems: No fevers, chills, night sweats, weight loss, chest pain, or shortness of breath.   Objective:    General: Well Developed, well nourished, and in no acute distress.  Neuro: Alert and oriented x3, extra-ocular muscles intact, sensation grossly intact. She has a tremor in her head and tremor in arms when she lifts them.  HEENT: Normocephalic, atraumatic  Skin: Warm and dry, no rashes. Cardiac: Regular rate and rhythm, no murmurs rubs or gallops, no lower extremity edema.  Respiratory: Clear to auscultation bilaterally. Not using accessory muscles, speaking in full sentences.   Impression and Recommendations:    HTN -uncontrolled but she is also off of her blood pressure medication.  It  also somehow was inadvertently deleted from her medication list him to start propranolol which could help with her tremor in addition to lowering her blood pressure.  We can adjust the dose as needed.  Normally I would have her come back in 2-3 weeks but again she is not going to have transportation at that time.  Anxiety  - GAD - 7 score of 7.  we can add treatment such as propranolol starting with 10 mg daily or primidone starting with 12.5 mg daily and then adjust dose as tolerated.  COPD -able.  It sounds like her cough is really coming more from her throat and postnasal drip rather than her COPD.  Recommend a trial of an over-the-counter oral antihistamine since she does not want to use a nasal spray.  Essential tremor -we will try propranolol.  If this is not successful consider primidone.  Chronic pain management -I did refill her medication today.  The last time we feel that it lasted a couple of months so I am hoping that she will do that again since she has difficulty with transportation getting here.  She was unable to give a urine sample because she just emptied her bladder before coming in today.  I reminded her that at the next office visit we would not be able to dispense her medications without a urine sample.  She does drink an occasional beer but encouraged her to make sure that she is not using alcohol daily or in  excess.  Explained that it does not mix well with her current medications.    Cervical strain-we will get x-rays today since her pain continues she would likely benefit from physical therapy but we would have to do it through home health.

## 2017-11-12 NOTE — Addendum Note (Signed)
Addended by: Huel Cote on: 11/12/2017 04:04 PM   Modules accepted: Orders

## 2017-11-26 DIAGNOSIS — G894 Chronic pain syndrome: Secondary | ICD-10-CM | POA: Diagnosis not present

## 2017-11-26 DIAGNOSIS — J449 Chronic obstructive pulmonary disease, unspecified: Secondary | ICD-10-CM | POA: Diagnosis not present

## 2017-11-26 DIAGNOSIS — I10 Essential (primary) hypertension: Secondary | ICD-10-CM | POA: Diagnosis not present

## 2017-11-26 DIAGNOSIS — R2681 Unsteadiness on feet: Secondary | ICD-10-CM | POA: Diagnosis not present

## 2017-12-11 ENCOUNTER — Other Ambulatory Visit: Payer: Self-pay | Admitting: Family Medicine

## 2017-12-11 DIAGNOSIS — F411 Generalized anxiety disorder: Secondary | ICD-10-CM

## 2017-12-11 NOTE — Telephone Encounter (Signed)
This was last filled 11/08/17 #30. Will fwd to pcp for approval.Dontarious Schaum, [place]

## 2018-01-13 ENCOUNTER — Other Ambulatory Visit: Payer: Self-pay | Admitting: Family Medicine

## 2018-01-13 DIAGNOSIS — F411 Generalized anxiety disorder: Secondary | ICD-10-CM

## 2018-02-06 ENCOUNTER — Other Ambulatory Visit: Payer: Self-pay | Admitting: Family Medicine

## 2018-02-06 DIAGNOSIS — F411 Generalized anxiety disorder: Secondary | ICD-10-CM

## 2018-02-13 ENCOUNTER — Encounter: Payer: Self-pay | Admitting: Family Medicine

## 2018-02-13 ENCOUNTER — Ambulatory Visit (INDEPENDENT_AMBULATORY_CARE_PROVIDER_SITE_OTHER): Payer: Medicare Other | Admitting: Family Medicine

## 2018-02-13 VITALS — BP 108/72 | HR 100 | Wt 142.0 lb

## 2018-02-13 DIAGNOSIS — M542 Cervicalgia: Secondary | ICD-10-CM

## 2018-02-13 DIAGNOSIS — G8929 Other chronic pain: Secondary | ICD-10-CM | POA: Diagnosis not present

## 2018-02-13 DIAGNOSIS — J449 Chronic obstructive pulmonary disease, unspecified: Secondary | ICD-10-CM | POA: Diagnosis not present

## 2018-02-13 DIAGNOSIS — G250 Essential tremor: Secondary | ICD-10-CM | POA: Diagnosis not present

## 2018-02-13 DIAGNOSIS — F411 Generalized anxiety disorder: Secondary | ICD-10-CM | POA: Diagnosis not present

## 2018-02-13 MED ORDER — AMLODIPINE BESYLATE 5 MG PO TABS: ORAL_TABLET | ORAL | 1 refills | Status: AC

## 2018-02-13 MED ORDER — ALPRAZOLAM 0.5 MG PO TABS: ORAL_TABLET | ORAL | 2 refills | Status: AC

## 2018-02-13 MED ORDER — HYDROCODONE-ACETAMINOPHEN 5-325 MG PO TABS: 1.0000 | ORAL_TABLET | Freq: Two times a day (BID) | ORAL | 0 refills | Status: AC | PRN

## 2018-02-13 MED ORDER — UMECLIDINIUM-VILANTEROL 62.5-25 MCG/INH IN AEPB: 1.0000 | INHALATION_SPRAY | Freq: Every day | RESPIRATORY_TRACT | 5 refills | Status: AC

## 2018-02-13 NOTE — Progress Notes (Signed)
Pt needed recert for home O2.   O2 @ rest on room air was : 95%  O2 during exertion on room air: 88%  O2 during exertion on O2 94% 2L .Meredith Wilson, [place]

## 2018-02-13 NOTE — Progress Notes (Signed)
Subjective:    CC: tremor   HPI:  Anxiety -she is still taking Xanax and is down to 1 total tab daily.  She typically takes half a tab twice a day.  Last refilled in January.  Essential Tremor -patient reports that she was mostly taking her benzodiazepine because it was helping with her tremors so we decided to try putting her on propranolol which is more appropriate for tremor control.  With the idea that if it was not working well we could consider switching to primidone.  COPD- on Anoro.  Per our records she is out of refills so I am not sure how consistently she is actually using it.  HTN - she want sto go back on her amlodipine and restarted her medication.   Chronic neck pain-she uses her hydrocodone as needed mostly for neck pain.  Past medical history, Surgical history, Family history not pertinant except as noted below, Social history, Allergies, and medications have been entered into the medical record, reviewed, and corrections made.   Review of Systems: No fevers, chills, night sweats, weight loss, chest pain, or shortness of breath.   Objective:    General: Well Developed, well nourished, and in no acute distress.  Neuro: Alert and oriented x3, extra-ocular muscles intact, sensation grossly intact.  HEENT: Normocephalic, atraumatic  Skin: Warm and dry, no rashes. Cardiac: Regular rate and rhythm, no murmurs rubs or gallops, no lower extremity edema.  Respiratory: Clear to auscultation bilaterally. Not using accessory muscles, speaking in full sentences.   Impression and Recommendations:    Anxiety -we will continue with half a tab daily of the Xanax but warned about the potential life-threatening interaction with hydrocodone.  If at any point she starts using the medication more frequently then we will have to discontinue her Xanax completely.  Right now she says she just uses her hydrocodone when her neck flares.  Essential Tremor -propranolol caused nausea.  We  discussed primidone as an option.  Right now she wants to hold off and just stick with her half a tab of Xanax which she feels very strongly helps her tremor.  COPD -stable.  No recent flares and she has not had any upper respiratory infections this winter.  Hypertension-she would prefer to go back on the amlodipine so new prescription sent to pharmacy and I removed amlodipine from her intolerance list.  Per her daughter who is here with her today she tends to get fairly easily nauseated after taking her medications some not sure if it really is a medication side effect versus issues with her stomach.  Chronic pain management/neck pain-did refill her hydrocodone today.  She was able to give a urine sample and had her update her pain contract today.

## 2018-02-13 NOTE — Addendum Note (Signed)
Addended by: Beatrice Lecher D on: 02/13/2018 05:09 PM   Modules accepted: Orders

## 2018-02-18 LAB — DRUG ABUSE PANEL 10-20, W/ CONF, URINE
AMPHETAMINES (1000 ng/mL SCRN): NEGATIVE
BARBITURATES: NEGATIVE
BENZODIAZEPINES: POSITIVE — AB
COCAINE METABOLITES: NEGATIVE
METHADONE: NEGATIVE
METHAQUALONE: NEGATIVE
Marijuana Metabolites: NEGATIVE
OPIATES: NEGATIVE
PHENCYCLIDINE: NEGATIVE
PROPOXYPHENE: NEGATIVE

## 2018-03-05 DIAGNOSIS — J189 Pneumonia, unspecified organism: Secondary | ICD-10-CM | POA: Diagnosis not present

## 2018-03-05 DIAGNOSIS — J9621 Acute and chronic respiratory failure with hypoxia: Secondary | ICD-10-CM | POA: Diagnosis not present

## 2018-03-05 DIAGNOSIS — J440 Chronic obstructive pulmonary disease with acute lower respiratory infection: Secondary | ICD-10-CM | POA: Diagnosis not present

## 2018-03-05 DIAGNOSIS — J441 Chronic obstructive pulmonary disease with (acute) exacerbation: Secondary | ICD-10-CM | POA: Diagnosis not present

## 2018-03-08 ENCOUNTER — Other Ambulatory Visit: Payer: Self-pay | Admitting: Family Medicine

## 2018-04-09 ENCOUNTER — Telehealth: Payer: Self-pay | Admitting: *Deleted

## 2018-04-09 NOTE — Telephone Encounter (Signed)
Meredith Wilson called and stated that Meredith Wilson is moving to [facility] next week and will need an order faxed to [facility] discontinue Meredith O2   Fax to: [phone]. She stated that she already has a portable unit in place.Meredith Wilson, [place]

## 2018-04-10 MED ORDER — AMBULATORY NON FORMULARY MEDICATION: 0 refills | Status: AC

## 2018-04-22 ENCOUNTER — Encounter: Payer: Self-pay | Admitting: Family Medicine

## 2018-04-22 DIAGNOSIS — Z933 Colostomy status: Secondary | ICD-10-CM | POA: Insufficient documentation

## 2018-05-09 DIAGNOSIS — I10 Essential (primary) hypertension: Secondary | ICD-10-CM | POA: Diagnosis not present

## 2018-05-09 DIAGNOSIS — J4530 Mild persistent asthma, uncomplicated: Secondary | ICD-10-CM | POA: Diagnosis not present

## 2018-05-15 IMAGING — CT CT CERVICAL SPINE W/O CM
2 series · 13 of 27 positions shown, 16 images · non-contrast
Comparison: None.

CLINICAL DATA: Chronic right neck pain extending into the right
shoulder. Cervicalgia.

EXAM:
CT CERVICAL SPINE WITHOUT CONTRAST
TECHNIQUE: Multidetector CT imaging of the cervical spine was performed without
intravenous contrast. Multiplanar CT image reconstructions were also
generated.

[Series 4: c spine soft · axial · 0.43mm/px · z∈[-141,-15]mm · 8 of 75 slices shown, 10 images]
[im 6/75  soft-tissue]
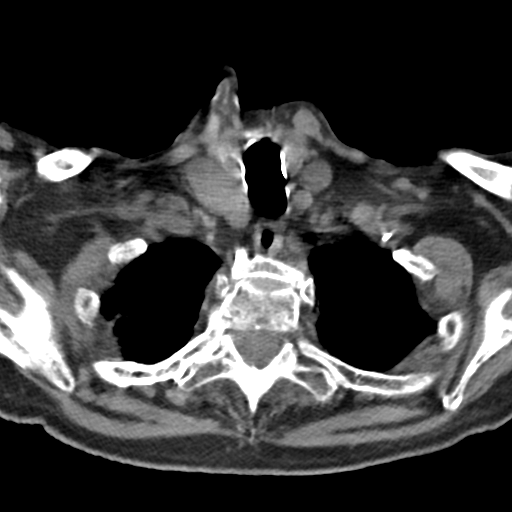
[im 6/75  bone]
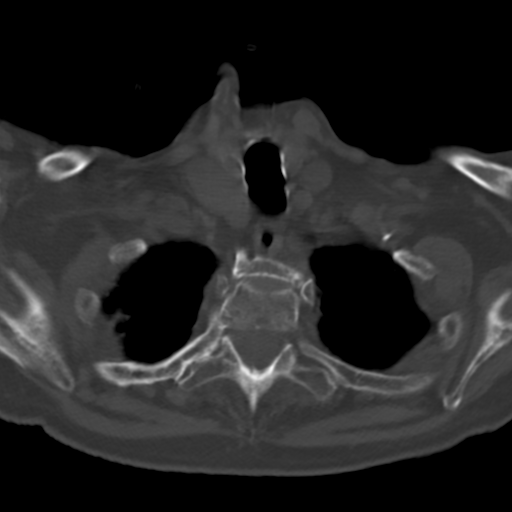
[im 18/75  bone]
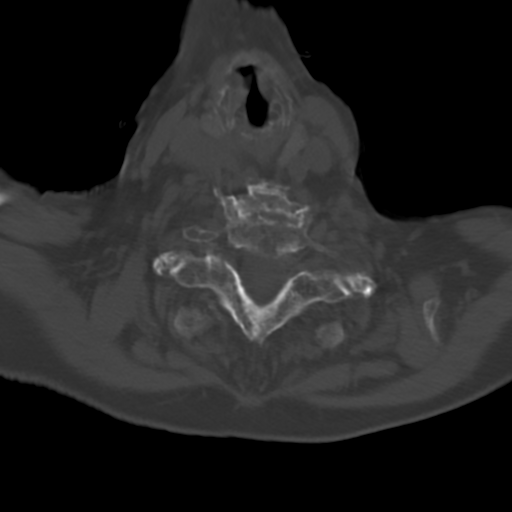
[im 23/75  bone]
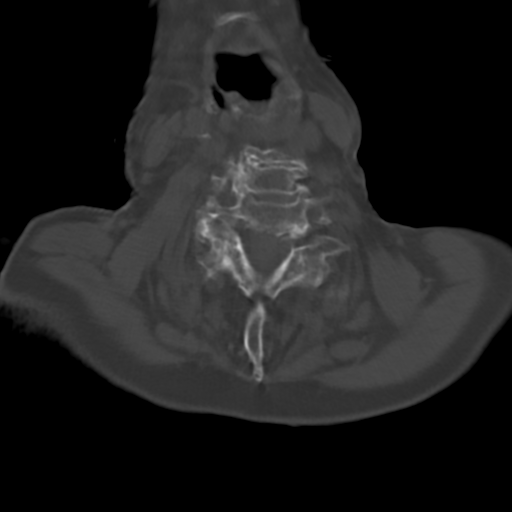
[im 35/75  bone]
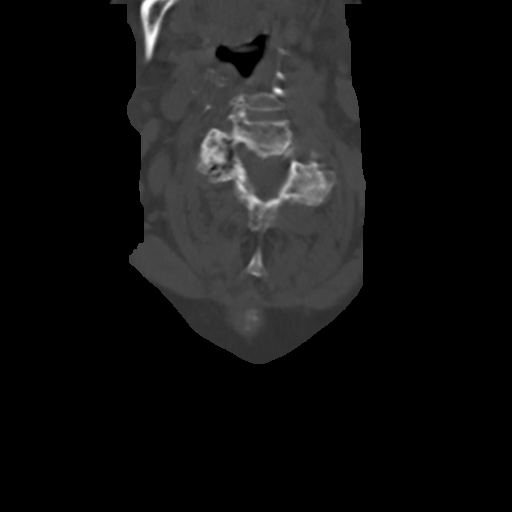
[im 40/75  soft-tissue]
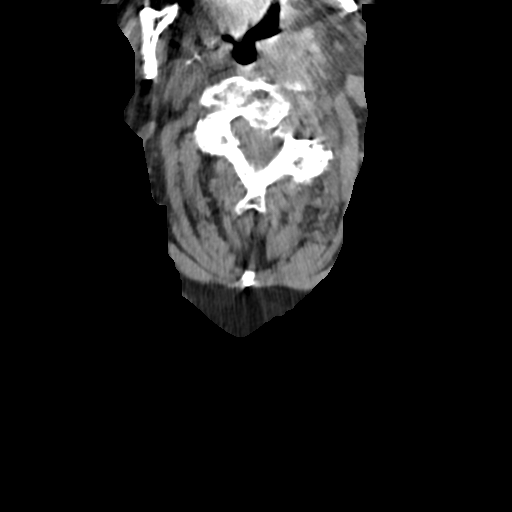
[im 40/75  bone]
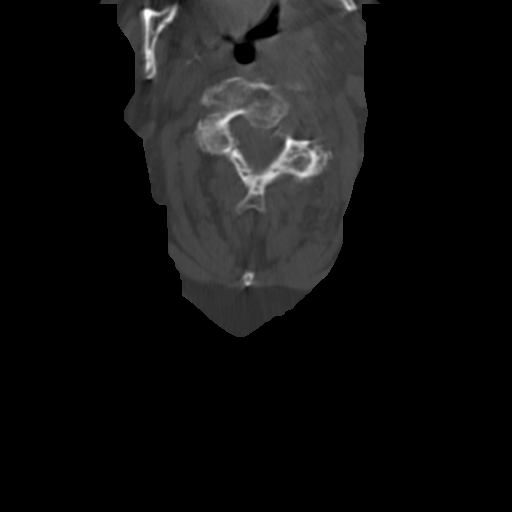
[im 52/75  bone]
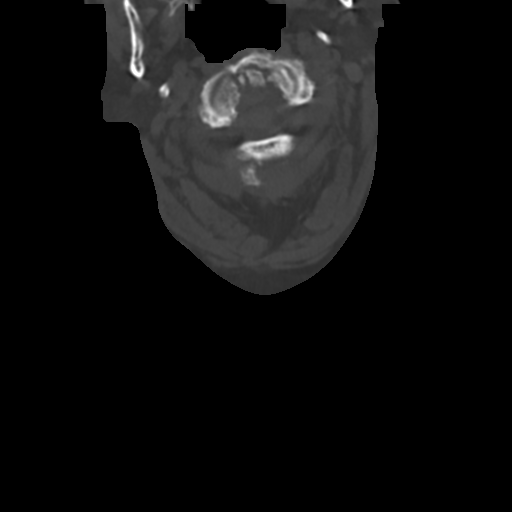
[im 57/75  bone]
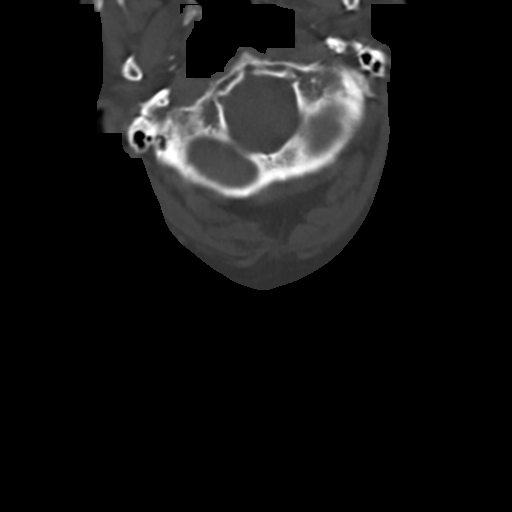
[im 69/75  bone]
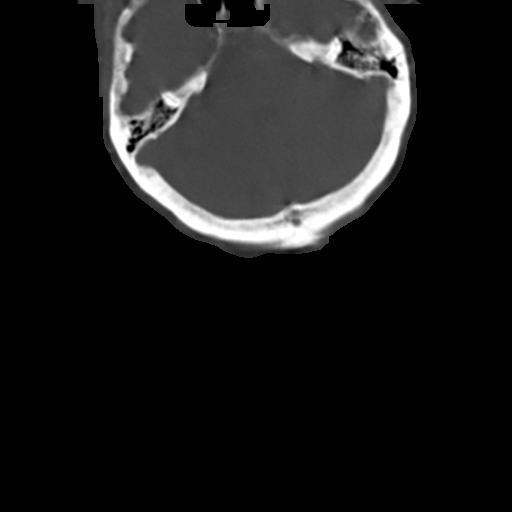

[Series 5: sagittal bone · sagittal · 0.31mm/px · 5 of 74 slices shown, 6 images]
[im 25/74  bone]
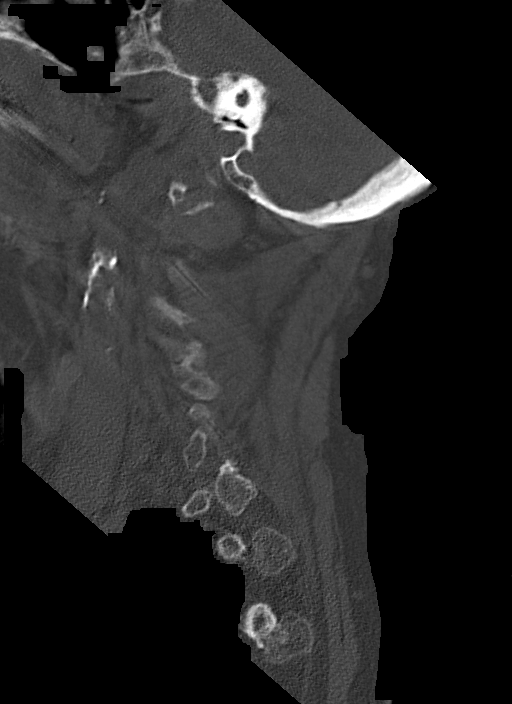
[im 31/74  bone]
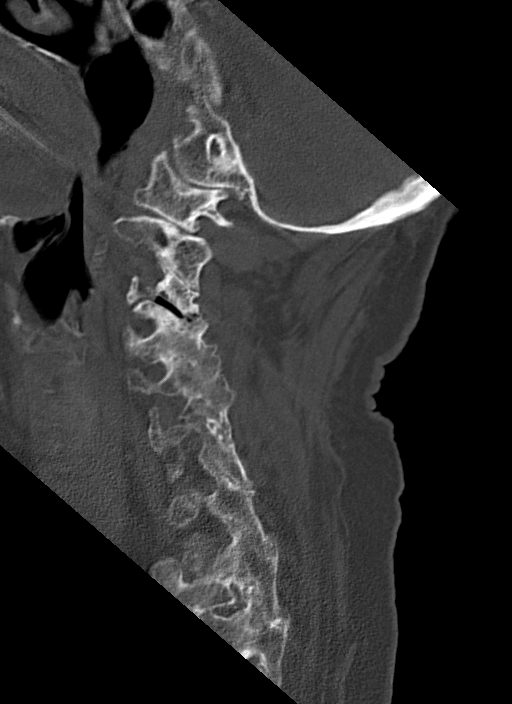
[im 37/74  soft-tissue]
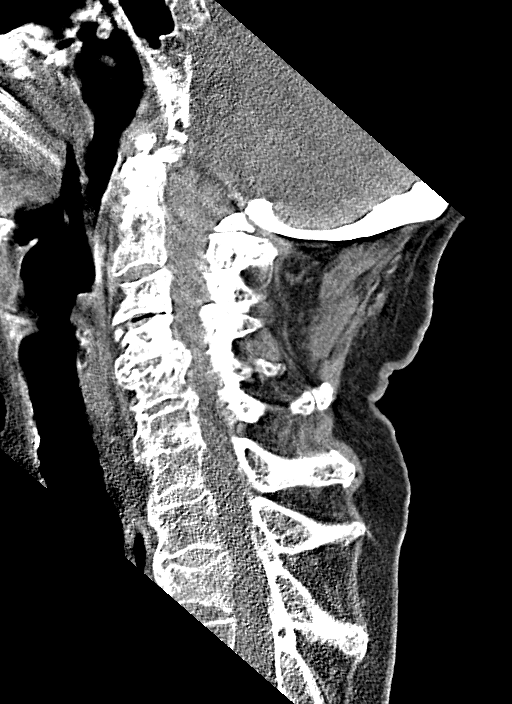
[im 37/74  bone]
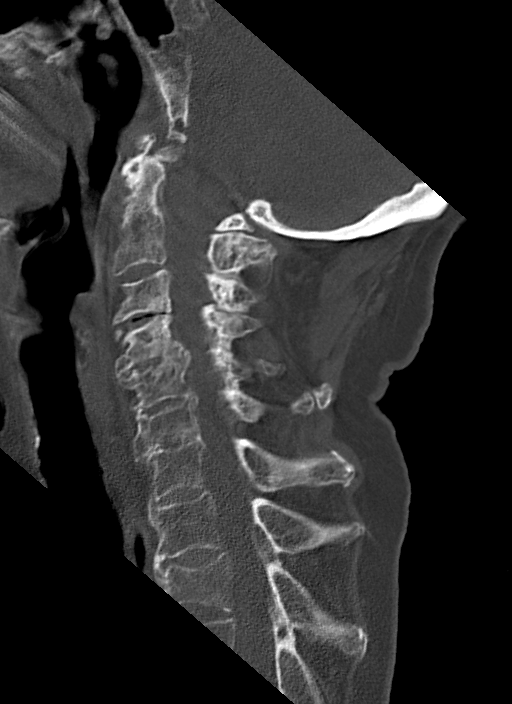
[im 43/74  bone]
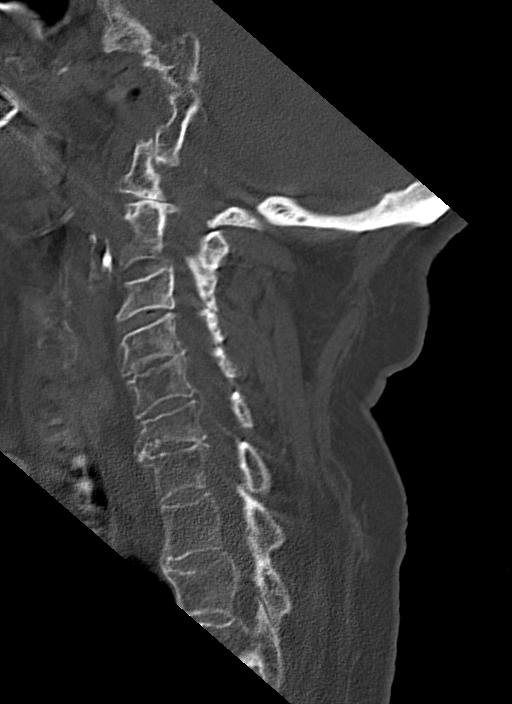
[im 49/74  bone]
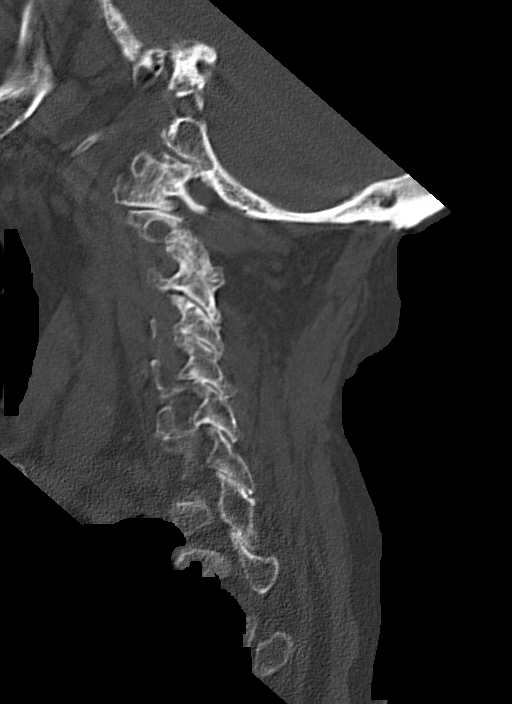

[13 of 27 positions shown; findings below may reference images not displayed]

FINDINGS: Alignment: Degenerative anterolisthesis at C4-5 measures 3 mm. There
is slight degenerative anterolisthesis at C5-6. AP alignment is
otherwise anatomic.

Skull base and vertebrae: Marked degenerative changes are present. A
remote fracture of the odontoid tip is present. The foramen magnum
is patent.

Inferior endplate fracture at C6 is remote.

Soft tissues and spinal canal: Atherosclerotic calcifications are
present at the carotid bifurcations bilaterally. The thyroid is
heterogeneous without a dominant nodule. No focal mass lesion is
present. There is no significant adenopathy.

Disc levels: C2-3: Advanced facet hypertrophy is present.
Uncovertebral disease contributes to moderate right and mild left
foraminal narrowing.

C3-4: Advanced facet hypertrophy and uncovertebral spurring leads to
severe right and moderate left foraminal stenosis.

C4-5: There is fusion across the disc space and facet joints. Severe
right and moderate left osseous foraminal narrowing is present.

C5-6: There is fusion across the disc space and posterior elements.
Moderate foraminal narrowing is present bilaterally.

C6-7: Advanced uncovertebral spurring contributes to moderate
foraminal narrowing bilaterally.

C7-T1: Advanced facet hypertrophy and spurring is present
bilaterally. Moderate left and mild right foraminal narrowing is
present.

Upper chest: The lung apices are clear. The thoracic inlet is
unremarkable.
IMPRESSION: 1. Multilevel spinal stenosis as described.
2. Severe right and moderate left foraminal stenosis at C3-4 and
C4-5.
3. Moderate right and mild left foraminal narrowing at C2-3.
4. Moderate foraminal narrowing bilaterally at C5-6 and C6-7.
5. Moderate left and mild right foraminal narrowing at C7-T1
secondary to facet disease.
6. No acute fracture or traumatic subluxation.
7. Remote fracture of the odontoid tip with moderate degenerative
changes of the craniocervical junction.
8. Anterior posterior autologous fusion is present at C4-5, C5-6,
and C6-7.

## 2018-05-19 ENCOUNTER — Ambulatory Visit: Payer: Medicare Other | Admitting: Family Medicine

## 2018-06-11 DIAGNOSIS — Z79899 Other long term (current) drug therapy: Secondary | ICD-10-CM | POA: Diagnosis not present

## 2018-07-28 DIAGNOSIS — I700 Atherosclerosis of aorta: Secondary | ICD-10-CM | POA: Diagnosis not present

## 2018-07-28 DIAGNOSIS — R0602 Shortness of breath: Secondary | ICD-10-CM | POA: Diagnosis not present

## 2018-08-11 DIAGNOSIS — R319 Hematuria, unspecified: Secondary | ICD-10-CM | POA: Diagnosis not present

## 2018-08-11 DIAGNOSIS — R0682 Tachypnea, not elsewhere classified: Secondary | ICD-10-CM | POA: Diagnosis not present

## 2018-08-11 DIAGNOSIS — E8770 Fluid overload, unspecified: Secondary | ICD-10-CM | POA: Diagnosis not present

## 2018-08-11 DIAGNOSIS — G250 Essential tremor: Secondary | ICD-10-CM | POA: Diagnosis not present

## 2018-08-11 DIAGNOSIS — N390 Urinary tract infection, site not specified: Secondary | ICD-10-CM | POA: Diagnosis not present

## 2018-08-11 DIAGNOSIS — D509 Iron deficiency anemia, unspecified: Secondary | ICD-10-CM | POA: Diagnosis not present

## 2018-08-11 DIAGNOSIS — I6789 Other cerebrovascular disease: Secondary | ICD-10-CM | POA: Diagnosis not present

## 2018-08-11 DIAGNOSIS — R531 Weakness: Secondary | ICD-10-CM | POA: Diagnosis not present

## 2018-08-11 DIAGNOSIS — E876 Hypokalemia: Secondary | ICD-10-CM | POA: Diagnosis not present

## 2018-08-11 DIAGNOSIS — E871 Hypo-osmolality and hyponatremia: Secondary | ICD-10-CM | POA: Diagnosis not present

## 2018-08-11 DIAGNOSIS — D72829 Elevated white blood cell count, unspecified: Secondary | ICD-10-CM | POA: Diagnosis not present

## 2018-08-11 DIAGNOSIS — I10 Essential (primary) hypertension: Secondary | ICD-10-CM | POA: Diagnosis not present

## 2018-08-11 DIAGNOSIS — R682 Dry mouth, unspecified: Secondary | ICD-10-CM | POA: Diagnosis not present

## 2018-08-11 DIAGNOSIS — R946 Abnormal results of thyroid function studies: Secondary | ICD-10-CM | POA: Diagnosis not present

## 2018-08-11 DIAGNOSIS — R51 Headache: Secondary | ICD-10-CM | POA: Diagnosis not present

## 2018-08-11 DIAGNOSIS — R0902 Hypoxemia: Secondary | ICD-10-CM | POA: Diagnosis not present

## 2018-08-14 DIAGNOSIS — G894 Chronic pain syndrome: Secondary | ICD-10-CM | POA: Diagnosis present

## 2018-08-14 DIAGNOSIS — Z85048 Personal history of other malignant neoplasm of rectum, rectosigmoid junction, and anus: Secondary | ICD-10-CM | POA: Diagnosis not present

## 2018-08-14 DIAGNOSIS — M6281 Muscle weakness (generalized): Secondary | ICD-10-CM | POA: Diagnosis present

## 2018-08-14 DIAGNOSIS — E0590 Thyrotoxicosis, unspecified without thyrotoxic crisis or storm: Secondary | ICD-10-CM | POA: Diagnosis present

## 2018-08-14 DIAGNOSIS — N390 Urinary tract infection, site not specified: Secondary | ICD-10-CM | POA: Diagnosis present

## 2018-08-14 DIAGNOSIS — R269 Unspecified abnormalities of gait and mobility: Secondary | ICD-10-CM | POA: Diagnosis present

## 2018-08-14 DIAGNOSIS — Z9981 Dependence on supplemental oxygen: Secondary | ICD-10-CM | POA: Diagnosis not present

## 2018-08-14 DIAGNOSIS — J449 Chronic obstructive pulmonary disease, unspecified: Secondary | ICD-10-CM | POA: Diagnosis present

## 2018-08-14 DIAGNOSIS — E876 Hypokalemia: Secondary | ICD-10-CM | POA: Diagnosis present

## 2018-08-14 DIAGNOSIS — G250 Essential tremor: Secondary | ICD-10-CM | POA: Diagnosis present

## 2018-08-14 DIAGNOSIS — R946 Abnormal results of thyroid function studies: Secondary | ICD-10-CM | POA: Diagnosis present

## 2018-08-14 DIAGNOSIS — E871 Hypo-osmolality and hyponatremia: Secondary | ICD-10-CM | POA: Diagnosis not present

## 2018-08-14 DIAGNOSIS — R0902 Hypoxemia: Secondary | ICD-10-CM | POA: Diagnosis present

## 2018-08-14 DIAGNOSIS — R202 Paresthesia of skin: Secondary | ICD-10-CM | POA: Diagnosis not present

## 2018-08-14 DIAGNOSIS — Z720 Tobacco use: Secondary | ICD-10-CM | POA: Diagnosis not present

## 2018-08-14 DIAGNOSIS — F419 Anxiety disorder, unspecified: Secondary | ICD-10-CM | POA: Diagnosis present

## 2018-08-14 DIAGNOSIS — R1312 Dysphagia, oropharyngeal phase: Secondary | ICD-10-CM | POA: Diagnosis present

## 2018-08-14 DIAGNOSIS — R0682 Tachypnea, not elsewhere classified: Secondary | ICD-10-CM | POA: Diagnosis present

## 2018-08-14 DIAGNOSIS — E8770 Fluid overload, unspecified: Secondary | ICD-10-CM | POA: Diagnosis present

## 2018-08-14 DIAGNOSIS — R531 Weakness: Secondary | ICD-10-CM | POA: Diagnosis not present

## 2018-08-14 DIAGNOSIS — E860 Dehydration: Secondary | ICD-10-CM | POA: Diagnosis not present

## 2018-08-14 DIAGNOSIS — Z79899 Other long term (current) drug therapy: Secondary | ICD-10-CM | POA: Diagnosis not present

## 2018-08-14 DIAGNOSIS — R918 Other nonspecific abnormal finding of lung field: Secondary | ICD-10-CM | POA: Diagnosis not present

## 2018-08-14 DIAGNOSIS — R200 Anesthesia of skin: Secondary | ICD-10-CM | POA: Diagnosis not present

## 2018-08-14 DIAGNOSIS — Z933 Colostomy status: Secondary | ICD-10-CM | POA: Diagnosis not present

## 2018-08-14 DIAGNOSIS — D509 Iron deficiency anemia, unspecified: Secondary | ICD-10-CM | POA: Diagnosis present

## 2018-08-14 DIAGNOSIS — F329 Major depressive disorder, single episode, unspecified: Secondary | ICD-10-CM | POA: Diagnosis present

## 2018-08-14 DIAGNOSIS — Z111 Encounter for screening for respiratory tuberculosis: Secondary | ICD-10-CM | POA: Diagnosis not present

## 2018-08-14 DIAGNOSIS — I10 Essential (primary) hypertension: Secondary | ICD-10-CM | POA: Diagnosis present

## 2018-08-14 DIAGNOSIS — D72829 Elevated white blood cell count, unspecified: Secondary | ICD-10-CM | POA: Diagnosis present

## 2018-08-20 DIAGNOSIS — J449 Chronic obstructive pulmonary disease, unspecified: Secondary | ICD-10-CM | POA: Diagnosis present

## 2018-08-20 DIAGNOSIS — M6281 Muscle weakness (generalized): Secondary | ICD-10-CM | POA: Diagnosis present

## 2018-08-20 DIAGNOSIS — R1312 Dysphagia, oropharyngeal phase: Secondary | ICD-10-CM | POA: Diagnosis present

## 2018-08-20 DIAGNOSIS — E871 Hypo-osmolality and hyponatremia: Secondary | ICD-10-CM | POA: Diagnosis present

## 2018-08-20 DIAGNOSIS — M542 Cervicalgia: Secondary | ICD-10-CM | POA: Diagnosis not present

## 2018-08-20 DIAGNOSIS — Z9981 Dependence on supplemental oxygen: Secondary | ICD-10-CM | POA: Diagnosis not present

## 2018-08-20 DIAGNOSIS — E0590 Thyrotoxicosis, unspecified without thyrotoxic crisis or storm: Secondary | ICD-10-CM | POA: Diagnosis present

## 2018-08-20 DIAGNOSIS — R269 Unspecified abnormalities of gait and mobility: Secondary | ICD-10-CM | POA: Diagnosis present

## 2018-08-20 DIAGNOSIS — D509 Iron deficiency anemia, unspecified: Secondary | ICD-10-CM | POA: Diagnosis present

## 2018-08-20 DIAGNOSIS — D649 Anemia, unspecified: Secondary | ICD-10-CM | POA: Diagnosis not present

## 2018-08-20 DIAGNOSIS — G5693 Unspecified mononeuropathy of bilateral upper limbs: Secondary | ICD-10-CM | POA: Diagnosis not present

## 2018-08-20 DIAGNOSIS — I10 Essential (primary) hypertension: Secondary | ICD-10-CM | POA: Diagnosis present

## 2018-08-20 DIAGNOSIS — F341 Dysthymic disorder: Secondary | ICD-10-CM | POA: Diagnosis not present

## 2018-08-20 DIAGNOSIS — G894 Chronic pain syndrome: Secondary | ICD-10-CM | POA: Diagnosis present

## 2018-08-20 DIAGNOSIS — R0902 Hypoxemia: Secondary | ICD-10-CM | POA: Diagnosis present

## 2018-08-20 DIAGNOSIS — K219 Gastro-esophageal reflux disease without esophagitis: Secondary | ICD-10-CM | POA: Diagnosis not present

## 2018-08-20 DIAGNOSIS — D72829 Elevated white blood cell count, unspecified: Secondary | ICD-10-CM | POA: Diagnosis present

## 2018-08-20 DIAGNOSIS — R531 Weakness: Secondary | ICD-10-CM | POA: Diagnosis not present

## 2018-08-20 DIAGNOSIS — J441 Chronic obstructive pulmonary disease with (acute) exacerbation: Secondary | ICD-10-CM | POA: Diagnosis not present

## 2018-08-21 DIAGNOSIS — I10 Essential (primary) hypertension: Secondary | ICD-10-CM | POA: Diagnosis not present

## 2018-08-21 DIAGNOSIS — J441 Chronic obstructive pulmonary disease with (acute) exacerbation: Secondary | ICD-10-CM | POA: Diagnosis not present

## 2018-08-21 DIAGNOSIS — F341 Dysthymic disorder: Secondary | ICD-10-CM | POA: Diagnosis not present

## 2018-08-21 DIAGNOSIS — K219 Gastro-esophageal reflux disease without esophagitis: Secondary | ICD-10-CM | POA: Diagnosis not present

## 2018-08-21 DIAGNOSIS — D649 Anemia, unspecified: Secondary | ICD-10-CM | POA: Diagnosis not present

## 2018-08-21 DIAGNOSIS — E871 Hypo-osmolality and hyponatremia: Secondary | ICD-10-CM | POA: Diagnosis not present

## 2018-09-04 DIAGNOSIS — K219 Gastro-esophageal reflux disease without esophagitis: Secondary | ICD-10-CM | POA: Diagnosis not present

## 2018-09-04 DIAGNOSIS — J441 Chronic obstructive pulmonary disease with (acute) exacerbation: Secondary | ICD-10-CM | POA: Diagnosis not present

## 2018-09-04 DIAGNOSIS — F341 Dysthymic disorder: Secondary | ICD-10-CM | POA: Diagnosis not present

## 2018-09-04 DIAGNOSIS — E871 Hypo-osmolality and hyponatremia: Secondary | ICD-10-CM | POA: Diagnosis not present

## 2018-09-04 DIAGNOSIS — G5693 Unspecified mononeuropathy of bilateral upper limbs: Secondary | ICD-10-CM | POA: Diagnosis not present

## 2018-09-04 DIAGNOSIS — D649 Anemia, unspecified: Secondary | ICD-10-CM | POA: Diagnosis not present

## 2018-09-04 DIAGNOSIS — I10 Essential (primary) hypertension: Secondary | ICD-10-CM | POA: Diagnosis not present

## 2018-09-04 DIAGNOSIS — M542 Cervicalgia: Secondary | ICD-10-CM | POA: Diagnosis not present

## 2018-09-11 DIAGNOSIS — F341 Dysthymic disorder: Secondary | ICD-10-CM | POA: Diagnosis not present

## 2018-09-11 DIAGNOSIS — M542 Cervicalgia: Secondary | ICD-10-CM | POA: Diagnosis not present

## 2018-09-11 DIAGNOSIS — J441 Chronic obstructive pulmonary disease with (acute) exacerbation: Secondary | ICD-10-CM | POA: Diagnosis not present

## 2018-09-11 DIAGNOSIS — G5693 Unspecified mononeuropathy of bilateral upper limbs: Secondary | ICD-10-CM | POA: Diagnosis not present

## 2018-09-11 DIAGNOSIS — K219 Gastro-esophageal reflux disease without esophagitis: Secondary | ICD-10-CM | POA: Diagnosis not present

## 2018-09-11 DIAGNOSIS — E871 Hypo-osmolality and hyponatremia: Secondary | ICD-10-CM | POA: Diagnosis not present

## 2018-09-11 DIAGNOSIS — I10 Essential (primary) hypertension: Secondary | ICD-10-CM | POA: Diagnosis not present

## 2018-09-11 DIAGNOSIS — D649 Anemia, unspecified: Secondary | ICD-10-CM | POA: Diagnosis not present

## 2018-09-17 DIAGNOSIS — Z9981 Dependence on supplemental oxygen: Secondary | ICD-10-CM | POA: Diagnosis not present

## 2018-09-17 DIAGNOSIS — M542 Cervicalgia: Secondary | ICD-10-CM | POA: Diagnosis not present

## 2018-09-17 DIAGNOSIS — Z933 Colostomy status: Secondary | ICD-10-CM | POA: Diagnosis not present

## 2018-09-17 DIAGNOSIS — D509 Iron deficiency anemia, unspecified: Secondary | ICD-10-CM | POA: Diagnosis not present

## 2018-09-17 DIAGNOSIS — Z8701 Personal history of pneumonia (recurrent): Secondary | ICD-10-CM | POA: Diagnosis not present

## 2018-09-17 DIAGNOSIS — F411 Generalized anxiety disorder: Secondary | ICD-10-CM | POA: Diagnosis not present

## 2018-09-17 DIAGNOSIS — K219 Gastro-esophageal reflux disease without esophagitis: Secondary | ICD-10-CM | POA: Diagnosis not present

## 2018-09-17 DIAGNOSIS — E871 Hypo-osmolality and hyponatremia: Secondary | ICD-10-CM | POA: Diagnosis not present

## 2018-09-17 DIAGNOSIS — I10 Essential (primary) hypertension: Secondary | ICD-10-CM | POA: Diagnosis not present

## 2018-09-17 DIAGNOSIS — G894 Chronic pain syndrome: Secondary | ICD-10-CM | POA: Diagnosis not present

## 2018-09-17 DIAGNOSIS — J441 Chronic obstructive pulmonary disease with (acute) exacerbation: Secondary | ICD-10-CM | POA: Diagnosis not present

## 2018-09-17 DIAGNOSIS — E0590 Thyrotoxicosis, unspecified without thyrotoxic crisis or storm: Secondary | ICD-10-CM | POA: Diagnosis not present

## 2018-09-17 DIAGNOSIS — Z9181 History of falling: Secondary | ICD-10-CM | POA: Diagnosis not present

## 2018-09-17 DIAGNOSIS — G9009 Other idiopathic peripheral autonomic neuropathy: Secondary | ICD-10-CM | POA: Diagnosis not present

## 2018-09-17 DIAGNOSIS — F341 Dysthymic disorder: Secondary | ICD-10-CM | POA: Diagnosis not present

## 2018-09-17 DIAGNOSIS — Z7951 Long term (current) use of inhaled steroids: Secondary | ICD-10-CM | POA: Diagnosis not present

## 2018-09-17 DIAGNOSIS — R1312 Dysphagia, oropharyngeal phase: Secondary | ICD-10-CM | POA: Diagnosis not present

## 2018-09-17 DIAGNOSIS — M1991 Primary osteoarthritis, unspecified site: Secondary | ICD-10-CM | POA: Diagnosis not present

## 2018-09-17 DIAGNOSIS — Z8673 Personal history of transient ischemic attack (TIA), and cerebral infarction without residual deficits: Secondary | ICD-10-CM | POA: Diagnosis not present

## 2018-09-17 DIAGNOSIS — Z87440 Personal history of urinary (tract) infections: Secondary | ICD-10-CM | POA: Diagnosis not present

## 2018-09-18 DIAGNOSIS — D509 Iron deficiency anemia, unspecified: Secondary | ICD-10-CM | POA: Diagnosis not present

## 2018-09-18 DIAGNOSIS — E871 Hypo-osmolality and hyponatremia: Secondary | ICD-10-CM | POA: Diagnosis not present

## 2018-09-18 DIAGNOSIS — M1991 Primary osteoarthritis, unspecified site: Secondary | ICD-10-CM | POA: Diagnosis not present

## 2018-09-18 DIAGNOSIS — I10 Essential (primary) hypertension: Secondary | ICD-10-CM | POA: Diagnosis not present

## 2018-09-18 DIAGNOSIS — G9009 Other idiopathic peripheral autonomic neuropathy: Secondary | ICD-10-CM | POA: Diagnosis not present

## 2018-09-18 DIAGNOSIS — J441 Chronic obstructive pulmonary disease with (acute) exacerbation: Secondary | ICD-10-CM | POA: Diagnosis not present

## 2018-09-23 DIAGNOSIS — F341 Dysthymic disorder: Secondary | ICD-10-CM | POA: Diagnosis not present

## 2018-09-23 DIAGNOSIS — D649 Anemia, unspecified: Secondary | ICD-10-CM | POA: Diagnosis not present

## 2018-09-23 DIAGNOSIS — I10 Essential (primary) hypertension: Secondary | ICD-10-CM | POA: Diagnosis not present

## 2018-09-23 DIAGNOSIS — G5693 Unspecified mononeuropathy of bilateral upper limbs: Secondary | ICD-10-CM | POA: Diagnosis not present

## 2018-09-23 DIAGNOSIS — D509 Iron deficiency anemia, unspecified: Secondary | ICD-10-CM | POA: Diagnosis not present

## 2018-09-23 DIAGNOSIS — M542 Cervicalgia: Secondary | ICD-10-CM | POA: Diagnosis not present

## 2018-09-23 DIAGNOSIS — K219 Gastro-esophageal reflux disease without esophagitis: Secondary | ICD-10-CM | POA: Diagnosis not present

## 2018-09-23 DIAGNOSIS — G9009 Other idiopathic peripheral autonomic neuropathy: Secondary | ICD-10-CM | POA: Diagnosis not present

## 2018-09-23 DIAGNOSIS — J441 Chronic obstructive pulmonary disease with (acute) exacerbation: Secondary | ICD-10-CM | POA: Diagnosis not present

## 2018-09-23 DIAGNOSIS — M1991 Primary osteoarthritis, unspecified site: Secondary | ICD-10-CM | POA: Diagnosis not present

## 2018-09-23 DIAGNOSIS — E871 Hypo-osmolality and hyponatremia: Secondary | ICD-10-CM | POA: Diagnosis not present

## 2018-09-24 DIAGNOSIS — M1991 Primary osteoarthritis, unspecified site: Secondary | ICD-10-CM | POA: Diagnosis not present

## 2018-09-24 DIAGNOSIS — D509 Iron deficiency anemia, unspecified: Secondary | ICD-10-CM | POA: Diagnosis not present

## 2018-09-24 DIAGNOSIS — E871 Hypo-osmolality and hyponatremia: Secondary | ICD-10-CM | POA: Diagnosis not present

## 2018-09-24 DIAGNOSIS — J441 Chronic obstructive pulmonary disease with (acute) exacerbation: Secondary | ICD-10-CM | POA: Diagnosis not present

## 2018-09-24 DIAGNOSIS — G9009 Other idiopathic peripheral autonomic neuropathy: Secondary | ICD-10-CM | POA: Diagnosis not present

## 2018-09-24 DIAGNOSIS — I10 Essential (primary) hypertension: Secondary | ICD-10-CM | POA: Diagnosis not present

## 2018-09-25 DIAGNOSIS — M1991 Primary osteoarthritis, unspecified site: Secondary | ICD-10-CM | POA: Diagnosis not present

## 2018-09-25 DIAGNOSIS — D509 Iron deficiency anemia, unspecified: Secondary | ICD-10-CM | POA: Diagnosis not present

## 2018-09-25 DIAGNOSIS — J441 Chronic obstructive pulmonary disease with (acute) exacerbation: Secondary | ICD-10-CM | POA: Diagnosis not present

## 2018-09-25 DIAGNOSIS — E871 Hypo-osmolality and hyponatremia: Secondary | ICD-10-CM | POA: Diagnosis not present

## 2018-09-25 DIAGNOSIS — I10 Essential (primary) hypertension: Secondary | ICD-10-CM | POA: Diagnosis not present

## 2018-09-25 DIAGNOSIS — G9009 Other idiopathic peripheral autonomic neuropathy: Secondary | ICD-10-CM | POA: Diagnosis not present

## 2018-09-30 DIAGNOSIS — I10 Essential (primary) hypertension: Secondary | ICD-10-CM | POA: Diagnosis not present

## 2018-09-30 DIAGNOSIS — J441 Chronic obstructive pulmonary disease with (acute) exacerbation: Secondary | ICD-10-CM | POA: Diagnosis not present

## 2018-09-30 DIAGNOSIS — E871 Hypo-osmolality and hyponatremia: Secondary | ICD-10-CM | POA: Diagnosis not present

## 2018-09-30 DIAGNOSIS — G9009 Other idiopathic peripheral autonomic neuropathy: Secondary | ICD-10-CM | POA: Diagnosis not present

## 2018-09-30 DIAGNOSIS — M1991 Primary osteoarthritis, unspecified site: Secondary | ICD-10-CM | POA: Diagnosis not present

## 2018-09-30 DIAGNOSIS — D509 Iron deficiency anemia, unspecified: Secondary | ICD-10-CM | POA: Diagnosis not present

## 2018-09-30 DIAGNOSIS — D649 Anemia, unspecified: Secondary | ICD-10-CM | POA: Diagnosis not present

## 2018-10-02 DIAGNOSIS — D509 Iron deficiency anemia, unspecified: Secondary | ICD-10-CM | POA: Diagnosis not present

## 2018-10-02 DIAGNOSIS — E871 Hypo-osmolality and hyponatremia: Secondary | ICD-10-CM | POA: Diagnosis not present

## 2018-10-02 DIAGNOSIS — I10 Essential (primary) hypertension: Secondary | ICD-10-CM | POA: Diagnosis not present

## 2018-10-02 DIAGNOSIS — J441 Chronic obstructive pulmonary disease with (acute) exacerbation: Secondary | ICD-10-CM | POA: Diagnosis not present

## 2018-10-02 DIAGNOSIS — M1991 Primary osteoarthritis, unspecified site: Secondary | ICD-10-CM | POA: Diagnosis not present

## 2018-10-02 DIAGNOSIS — G9009 Other idiopathic peripheral autonomic neuropathy: Secondary | ICD-10-CM | POA: Diagnosis not present

## 2018-10-06 DIAGNOSIS — D509 Iron deficiency anemia, unspecified: Secondary | ICD-10-CM | POA: Diagnosis not present

## 2018-10-06 DIAGNOSIS — M1991 Primary osteoarthritis, unspecified site: Secondary | ICD-10-CM | POA: Diagnosis not present

## 2018-10-06 DIAGNOSIS — E871 Hypo-osmolality and hyponatremia: Secondary | ICD-10-CM | POA: Diagnosis not present

## 2018-10-06 DIAGNOSIS — G9009 Other idiopathic peripheral autonomic neuropathy: Secondary | ICD-10-CM | POA: Diagnosis not present

## 2018-10-06 DIAGNOSIS — I10 Essential (primary) hypertension: Secondary | ICD-10-CM | POA: Diagnosis not present

## 2018-10-06 DIAGNOSIS — J441 Chronic obstructive pulmonary disease with (acute) exacerbation: Secondary | ICD-10-CM | POA: Diagnosis not present

## 2018-10-08 DIAGNOSIS — D509 Iron deficiency anemia, unspecified: Secondary | ICD-10-CM | POA: Diagnosis not present

## 2018-10-08 DIAGNOSIS — J441 Chronic obstructive pulmonary disease with (acute) exacerbation: Secondary | ICD-10-CM | POA: Diagnosis not present

## 2018-10-08 DIAGNOSIS — M1991 Primary osteoarthritis, unspecified site: Secondary | ICD-10-CM | POA: Diagnosis not present

## 2018-10-08 DIAGNOSIS — I10 Essential (primary) hypertension: Secondary | ICD-10-CM | POA: Diagnosis not present

## 2018-10-08 DIAGNOSIS — G9009 Other idiopathic peripheral autonomic neuropathy: Secondary | ICD-10-CM | POA: Diagnosis not present

## 2018-10-08 DIAGNOSIS — E871 Hypo-osmolality and hyponatremia: Secondary | ICD-10-CM | POA: Diagnosis not present

## 2018-10-09 DIAGNOSIS — I10 Essential (primary) hypertension: Secondary | ICD-10-CM | POA: Diagnosis not present

## 2018-10-09 DIAGNOSIS — M1991 Primary osteoarthritis, unspecified site: Secondary | ICD-10-CM | POA: Diagnosis not present

## 2018-10-09 DIAGNOSIS — G9009 Other idiopathic peripheral autonomic neuropathy: Secondary | ICD-10-CM | POA: Diagnosis not present

## 2018-10-09 DIAGNOSIS — D509 Iron deficiency anemia, unspecified: Secondary | ICD-10-CM | POA: Diagnosis not present

## 2018-10-09 DIAGNOSIS — J441 Chronic obstructive pulmonary disease with (acute) exacerbation: Secondary | ICD-10-CM | POA: Diagnosis not present

## 2018-10-09 DIAGNOSIS — E871 Hypo-osmolality and hyponatremia: Secondary | ICD-10-CM | POA: Diagnosis not present

## 2018-10-10 DIAGNOSIS — E871 Hypo-osmolality and hyponatremia: Secondary | ICD-10-CM | POA: Diagnosis not present

## 2018-10-10 DIAGNOSIS — D509 Iron deficiency anemia, unspecified: Secondary | ICD-10-CM | POA: Diagnosis not present

## 2018-10-10 DIAGNOSIS — G9009 Other idiopathic peripheral autonomic neuropathy: Secondary | ICD-10-CM | POA: Diagnosis not present

## 2018-10-10 DIAGNOSIS — I10 Essential (primary) hypertension: Secondary | ICD-10-CM | POA: Diagnosis not present

## 2018-10-10 DIAGNOSIS — M1991 Primary osteoarthritis, unspecified site: Secondary | ICD-10-CM | POA: Diagnosis not present

## 2018-10-10 DIAGNOSIS — J441 Chronic obstructive pulmonary disease with (acute) exacerbation: Secondary | ICD-10-CM | POA: Diagnosis not present

## 2018-10-13 DIAGNOSIS — G9009 Other idiopathic peripheral autonomic neuropathy: Secondary | ICD-10-CM | POA: Diagnosis not present

## 2018-10-13 DIAGNOSIS — E871 Hypo-osmolality and hyponatremia: Secondary | ICD-10-CM | POA: Diagnosis not present

## 2018-10-13 DIAGNOSIS — M1991 Primary osteoarthritis, unspecified site: Secondary | ICD-10-CM | POA: Diagnosis not present

## 2018-10-13 DIAGNOSIS — I10 Essential (primary) hypertension: Secondary | ICD-10-CM | POA: Diagnosis not present

## 2018-10-13 DIAGNOSIS — J441 Chronic obstructive pulmonary disease with (acute) exacerbation: Secondary | ICD-10-CM | POA: Diagnosis not present

## 2018-10-13 DIAGNOSIS — D509 Iron deficiency anemia, unspecified: Secondary | ICD-10-CM | POA: Diagnosis not present

## 2018-10-15 DIAGNOSIS — M1991 Primary osteoarthritis, unspecified site: Secondary | ICD-10-CM | POA: Diagnosis not present

## 2018-10-15 DIAGNOSIS — J441 Chronic obstructive pulmonary disease with (acute) exacerbation: Secondary | ICD-10-CM | POA: Diagnosis not present

## 2018-10-15 DIAGNOSIS — E871 Hypo-osmolality and hyponatremia: Secondary | ICD-10-CM | POA: Diagnosis not present

## 2018-10-15 DIAGNOSIS — G9009 Other idiopathic peripheral autonomic neuropathy: Secondary | ICD-10-CM | POA: Diagnosis not present

## 2018-10-15 DIAGNOSIS — D509 Iron deficiency anemia, unspecified: Secondary | ICD-10-CM | POA: Diagnosis not present

## 2018-10-15 DIAGNOSIS — I10 Essential (primary) hypertension: Secondary | ICD-10-CM | POA: Diagnosis not present

## 2018-10-16 DIAGNOSIS — D509 Iron deficiency anemia, unspecified: Secondary | ICD-10-CM | POA: Diagnosis not present

## 2018-10-16 DIAGNOSIS — G9009 Other idiopathic peripheral autonomic neuropathy: Secondary | ICD-10-CM | POA: Diagnosis not present

## 2018-10-16 DIAGNOSIS — E871 Hypo-osmolality and hyponatremia: Secondary | ICD-10-CM | POA: Diagnosis not present

## 2018-10-16 DIAGNOSIS — I10 Essential (primary) hypertension: Secondary | ICD-10-CM | POA: Diagnosis not present

## 2018-10-16 DIAGNOSIS — J441 Chronic obstructive pulmonary disease with (acute) exacerbation: Secondary | ICD-10-CM | POA: Diagnosis not present

## 2018-10-16 DIAGNOSIS — M1991 Primary osteoarthritis, unspecified site: Secondary | ICD-10-CM | POA: Diagnosis not present

## 2018-10-20 DIAGNOSIS — G9009 Other idiopathic peripheral autonomic neuropathy: Secondary | ICD-10-CM | POA: Diagnosis not present

## 2018-10-20 DIAGNOSIS — M1991 Primary osteoarthritis, unspecified site: Secondary | ICD-10-CM | POA: Diagnosis not present

## 2018-10-20 DIAGNOSIS — I10 Essential (primary) hypertension: Secondary | ICD-10-CM | POA: Diagnosis not present

## 2018-10-20 DIAGNOSIS — D509 Iron deficiency anemia, unspecified: Secondary | ICD-10-CM | POA: Diagnosis not present

## 2018-10-20 DIAGNOSIS — J441 Chronic obstructive pulmonary disease with (acute) exacerbation: Secondary | ICD-10-CM | POA: Diagnosis not present

## 2018-10-20 DIAGNOSIS — E871 Hypo-osmolality and hyponatremia: Secondary | ICD-10-CM | POA: Diagnosis not present

## 2018-10-22 DIAGNOSIS — I10 Essential (primary) hypertension: Secondary | ICD-10-CM | POA: Diagnosis not present

## 2018-10-22 DIAGNOSIS — D509 Iron deficiency anemia, unspecified: Secondary | ICD-10-CM | POA: Diagnosis not present

## 2018-10-22 DIAGNOSIS — J441 Chronic obstructive pulmonary disease with (acute) exacerbation: Secondary | ICD-10-CM | POA: Diagnosis not present

## 2018-10-22 DIAGNOSIS — G9009 Other idiopathic peripheral autonomic neuropathy: Secondary | ICD-10-CM | POA: Diagnosis not present

## 2018-10-22 DIAGNOSIS — E871 Hypo-osmolality and hyponatremia: Secondary | ICD-10-CM | POA: Diagnosis not present

## 2018-10-22 DIAGNOSIS — M1991 Primary osteoarthritis, unspecified site: Secondary | ICD-10-CM | POA: Diagnosis not present

## 2018-10-27 DIAGNOSIS — M1991 Primary osteoarthritis, unspecified site: Secondary | ICD-10-CM | POA: Diagnosis not present

## 2018-10-27 DIAGNOSIS — D509 Iron deficiency anemia, unspecified: Secondary | ICD-10-CM | POA: Diagnosis not present

## 2018-10-27 DIAGNOSIS — G9009 Other idiopathic peripheral autonomic neuropathy: Secondary | ICD-10-CM | POA: Diagnosis not present

## 2018-10-27 DIAGNOSIS — J441 Chronic obstructive pulmonary disease with (acute) exacerbation: Secondary | ICD-10-CM | POA: Diagnosis not present

## 2018-10-27 DIAGNOSIS — I10 Essential (primary) hypertension: Secondary | ICD-10-CM | POA: Diagnosis not present

## 2018-10-27 DIAGNOSIS — E871 Hypo-osmolality and hyponatremia: Secondary | ICD-10-CM | POA: Diagnosis not present

## 2018-10-29 DIAGNOSIS — E871 Hypo-osmolality and hyponatremia: Secondary | ICD-10-CM | POA: Diagnosis not present

## 2018-10-29 DIAGNOSIS — M1991 Primary osteoarthritis, unspecified site: Secondary | ICD-10-CM | POA: Diagnosis not present

## 2018-10-29 DIAGNOSIS — J441 Chronic obstructive pulmonary disease with (acute) exacerbation: Secondary | ICD-10-CM | POA: Diagnosis not present

## 2018-10-29 DIAGNOSIS — I10 Essential (primary) hypertension: Secondary | ICD-10-CM | POA: Diagnosis not present

## 2018-10-29 DIAGNOSIS — G9009 Other idiopathic peripheral autonomic neuropathy: Secondary | ICD-10-CM | POA: Diagnosis not present

## 2018-10-29 DIAGNOSIS — D509 Iron deficiency anemia, unspecified: Secondary | ICD-10-CM | POA: Diagnosis not present

## 2018-11-03 DIAGNOSIS — D509 Iron deficiency anemia, unspecified: Secondary | ICD-10-CM | POA: Diagnosis not present

## 2018-11-03 DIAGNOSIS — I10 Essential (primary) hypertension: Secondary | ICD-10-CM | POA: Diagnosis not present

## 2018-11-03 DIAGNOSIS — E871 Hypo-osmolality and hyponatremia: Secondary | ICD-10-CM | POA: Diagnosis not present

## 2018-11-03 DIAGNOSIS — J441 Chronic obstructive pulmonary disease with (acute) exacerbation: Secondary | ICD-10-CM | POA: Diagnosis not present

## 2018-11-03 DIAGNOSIS — G9009 Other idiopathic peripheral autonomic neuropathy: Secondary | ICD-10-CM | POA: Diagnosis not present

## 2018-11-03 DIAGNOSIS — M1991 Primary osteoarthritis, unspecified site: Secondary | ICD-10-CM | POA: Diagnosis not present

## 2018-11-04 DIAGNOSIS — I10 Essential (primary) hypertension: Secondary | ICD-10-CM | POA: Diagnosis not present

## 2018-11-04 DIAGNOSIS — M1991 Primary osteoarthritis, unspecified site: Secondary | ICD-10-CM | POA: Diagnosis not present

## 2018-11-04 DIAGNOSIS — G9009 Other idiopathic peripheral autonomic neuropathy: Secondary | ICD-10-CM | POA: Diagnosis not present

## 2018-11-04 DIAGNOSIS — J441 Chronic obstructive pulmonary disease with (acute) exacerbation: Secondary | ICD-10-CM | POA: Diagnosis not present

## 2018-11-04 DIAGNOSIS — D509 Iron deficiency anemia, unspecified: Secondary | ICD-10-CM | POA: Diagnosis not present

## 2018-11-04 DIAGNOSIS — E871 Hypo-osmolality and hyponatremia: Secondary | ICD-10-CM | POA: Diagnosis not present

## 2018-11-10 DIAGNOSIS — G9009 Other idiopathic peripheral autonomic neuropathy: Secondary | ICD-10-CM | POA: Diagnosis not present

## 2018-11-10 DIAGNOSIS — D509 Iron deficiency anemia, unspecified: Secondary | ICD-10-CM | POA: Diagnosis not present

## 2018-11-10 DIAGNOSIS — I10 Essential (primary) hypertension: Secondary | ICD-10-CM | POA: Diagnosis not present

## 2018-11-10 DIAGNOSIS — E871 Hypo-osmolality and hyponatremia: Secondary | ICD-10-CM | POA: Diagnosis not present

## 2018-11-10 DIAGNOSIS — M1991 Primary osteoarthritis, unspecified site: Secondary | ICD-10-CM | POA: Diagnosis not present

## 2018-11-10 DIAGNOSIS — J441 Chronic obstructive pulmonary disease with (acute) exacerbation: Secondary | ICD-10-CM | POA: Diagnosis not present

## 2018-11-12 DIAGNOSIS — I10 Essential (primary) hypertension: Secondary | ICD-10-CM | POA: Diagnosis not present

## 2018-11-12 DIAGNOSIS — M1991 Primary osteoarthritis, unspecified site: Secondary | ICD-10-CM | POA: Diagnosis not present

## 2018-11-12 DIAGNOSIS — J441 Chronic obstructive pulmonary disease with (acute) exacerbation: Secondary | ICD-10-CM | POA: Diagnosis not present

## 2018-11-12 DIAGNOSIS — G9009 Other idiopathic peripheral autonomic neuropathy: Secondary | ICD-10-CM | POA: Diagnosis not present

## 2018-11-12 DIAGNOSIS — E871 Hypo-osmolality and hyponatremia: Secondary | ICD-10-CM | POA: Diagnosis not present

## 2018-11-12 DIAGNOSIS — D509 Iron deficiency anemia, unspecified: Secondary | ICD-10-CM | POA: Diagnosis not present

## 2018-11-13 DIAGNOSIS — D509 Iron deficiency anemia, unspecified: Secondary | ICD-10-CM | POA: Diagnosis not present

## 2018-11-13 DIAGNOSIS — E871 Hypo-osmolality and hyponatremia: Secondary | ICD-10-CM | POA: Diagnosis not present

## 2018-11-13 DIAGNOSIS — I10 Essential (primary) hypertension: Secondary | ICD-10-CM | POA: Diagnosis not present

## 2018-11-13 DIAGNOSIS — G9009 Other idiopathic peripheral autonomic neuropathy: Secondary | ICD-10-CM | POA: Diagnosis not present

## 2018-11-13 DIAGNOSIS — J441 Chronic obstructive pulmonary disease with (acute) exacerbation: Secondary | ICD-10-CM | POA: Diagnosis not present

## 2018-11-13 DIAGNOSIS — M1991 Primary osteoarthritis, unspecified site: Secondary | ICD-10-CM | POA: Diagnosis not present

## 2018-11-16 DIAGNOSIS — J441 Chronic obstructive pulmonary disease with (acute) exacerbation: Secondary | ICD-10-CM | POA: Diagnosis not present

## 2018-11-16 DIAGNOSIS — D509 Iron deficiency anemia, unspecified: Secondary | ICD-10-CM | POA: Diagnosis not present

## 2018-11-16 DIAGNOSIS — E0590 Thyrotoxicosis, unspecified without thyrotoxic crisis or storm: Secondary | ICD-10-CM | POA: Diagnosis not present

## 2018-11-16 DIAGNOSIS — Z9181 History of falling: Secondary | ICD-10-CM | POA: Diagnosis not present

## 2018-11-16 DIAGNOSIS — Z8673 Personal history of transient ischemic attack (TIA), and cerebral infarction without residual deficits: Secondary | ICD-10-CM | POA: Diagnosis not present

## 2018-11-16 DIAGNOSIS — Z8701 Personal history of pneumonia (recurrent): Secondary | ICD-10-CM | POA: Diagnosis not present

## 2018-11-16 DIAGNOSIS — Z9981 Dependence on supplemental oxygen: Secondary | ICD-10-CM | POA: Diagnosis not present

## 2018-11-16 DIAGNOSIS — F341 Dysthymic disorder: Secondary | ICD-10-CM | POA: Diagnosis not present

## 2018-11-16 DIAGNOSIS — M542 Cervicalgia: Secondary | ICD-10-CM | POA: Diagnosis not present

## 2018-11-16 DIAGNOSIS — E871 Hypo-osmolality and hyponatremia: Secondary | ICD-10-CM | POA: Diagnosis not present

## 2018-11-16 DIAGNOSIS — Z933 Colostomy status: Secondary | ICD-10-CM | POA: Diagnosis not present

## 2018-11-16 DIAGNOSIS — K219 Gastro-esophageal reflux disease without esophagitis: Secondary | ICD-10-CM | POA: Diagnosis not present

## 2018-11-16 DIAGNOSIS — G894 Chronic pain syndrome: Secondary | ICD-10-CM | POA: Diagnosis not present

## 2018-11-16 DIAGNOSIS — I10 Essential (primary) hypertension: Secondary | ICD-10-CM | POA: Diagnosis not present

## 2018-11-16 DIAGNOSIS — Z87440 Personal history of urinary (tract) infections: Secondary | ICD-10-CM | POA: Diagnosis not present

## 2018-11-16 DIAGNOSIS — Z7951 Long term (current) use of inhaled steroids: Secondary | ICD-10-CM | POA: Diagnosis not present

## 2018-11-16 DIAGNOSIS — F411 Generalized anxiety disorder: Secondary | ICD-10-CM | POA: Diagnosis not present

## 2018-11-16 DIAGNOSIS — M1991 Primary osteoarthritis, unspecified site: Secondary | ICD-10-CM | POA: Diagnosis not present

## 2018-11-16 DIAGNOSIS — R1312 Dysphagia, oropharyngeal phase: Secondary | ICD-10-CM | POA: Diagnosis not present

## 2018-11-16 DIAGNOSIS — G9009 Other idiopathic peripheral autonomic neuropathy: Secondary | ICD-10-CM | POA: Diagnosis not present

## 2018-11-18 DIAGNOSIS — J441 Chronic obstructive pulmonary disease with (acute) exacerbation: Secondary | ICD-10-CM | POA: Diagnosis not present

## 2018-11-18 DIAGNOSIS — E871 Hypo-osmolality and hyponatremia: Secondary | ICD-10-CM | POA: Diagnosis not present

## 2018-11-18 DIAGNOSIS — G9009 Other idiopathic peripheral autonomic neuropathy: Secondary | ICD-10-CM | POA: Diagnosis not present

## 2018-11-18 DIAGNOSIS — I10 Essential (primary) hypertension: Secondary | ICD-10-CM | POA: Diagnosis not present

## 2018-11-18 DIAGNOSIS — M1991 Primary osteoarthritis, unspecified site: Secondary | ICD-10-CM | POA: Diagnosis not present

## 2018-11-18 DIAGNOSIS — D509 Iron deficiency anemia, unspecified: Secondary | ICD-10-CM | POA: Diagnosis not present

## 2018-11-19 DIAGNOSIS — M1991 Primary osteoarthritis, unspecified site: Secondary | ICD-10-CM | POA: Diagnosis not present

## 2018-11-19 DIAGNOSIS — D509 Iron deficiency anemia, unspecified: Secondary | ICD-10-CM | POA: Diagnosis not present

## 2018-11-19 DIAGNOSIS — E871 Hypo-osmolality and hyponatremia: Secondary | ICD-10-CM | POA: Diagnosis not present

## 2018-11-19 DIAGNOSIS — I10 Essential (primary) hypertension: Secondary | ICD-10-CM | POA: Diagnosis not present

## 2018-11-19 DIAGNOSIS — G9009 Other idiopathic peripheral autonomic neuropathy: Secondary | ICD-10-CM | POA: Diagnosis not present

## 2018-11-19 DIAGNOSIS — J441 Chronic obstructive pulmonary disease with (acute) exacerbation: Secondary | ICD-10-CM | POA: Diagnosis not present

## 2018-11-25 DIAGNOSIS — M1991 Primary osteoarthritis, unspecified site: Secondary | ICD-10-CM | POA: Diagnosis not present

## 2018-11-25 DIAGNOSIS — I10 Essential (primary) hypertension: Secondary | ICD-10-CM | POA: Diagnosis not present

## 2018-11-25 DIAGNOSIS — D509 Iron deficiency anemia, unspecified: Secondary | ICD-10-CM | POA: Diagnosis not present

## 2018-11-25 DIAGNOSIS — E871 Hypo-osmolality and hyponatremia: Secondary | ICD-10-CM | POA: Diagnosis not present

## 2018-11-25 DIAGNOSIS — G9009 Other idiopathic peripheral autonomic neuropathy: Secondary | ICD-10-CM | POA: Diagnosis not present

## 2018-11-25 DIAGNOSIS — J441 Chronic obstructive pulmonary disease with (acute) exacerbation: Secondary | ICD-10-CM | POA: Diagnosis not present

## 2018-12-02 DIAGNOSIS — D649 Anemia, unspecified: Secondary | ICD-10-CM | POA: Diagnosis not present

## 2018-12-02 DIAGNOSIS — I10 Essential (primary) hypertension: Secondary | ICD-10-CM | POA: Diagnosis not present

## 2018-12-02 DIAGNOSIS — J441 Chronic obstructive pulmonary disease with (acute) exacerbation: Secondary | ICD-10-CM | POA: Diagnosis not present

## 2018-12-02 DIAGNOSIS — F341 Dysthymic disorder: Secondary | ICD-10-CM | POA: Diagnosis not present

## 2018-12-02 DIAGNOSIS — G5693 Unspecified mononeuropathy of bilateral upper limbs: Secondary | ICD-10-CM | POA: Diagnosis not present

## 2018-12-02 DIAGNOSIS — M542 Cervicalgia: Secondary | ICD-10-CM | POA: Diagnosis not present

## 2018-12-02 DIAGNOSIS — K219 Gastro-esophageal reflux disease without esophagitis: Secondary | ICD-10-CM | POA: Diagnosis not present

## 2018-12-02 DIAGNOSIS — E871 Hypo-osmolality and hyponatremia: Secondary | ICD-10-CM | POA: Diagnosis not present

## 2018-12-09 DIAGNOSIS — G9009 Other idiopathic peripheral autonomic neuropathy: Secondary | ICD-10-CM | POA: Diagnosis not present

## 2018-12-09 DIAGNOSIS — I10 Essential (primary) hypertension: Secondary | ICD-10-CM | POA: Diagnosis not present

## 2018-12-09 DIAGNOSIS — D509 Iron deficiency anemia, unspecified: Secondary | ICD-10-CM | POA: Diagnosis not present

## 2018-12-09 DIAGNOSIS — M1991 Primary osteoarthritis, unspecified site: Secondary | ICD-10-CM | POA: Diagnosis not present

## 2018-12-09 DIAGNOSIS — J441 Chronic obstructive pulmonary disease with (acute) exacerbation: Secondary | ICD-10-CM | POA: Diagnosis not present

## 2018-12-09 DIAGNOSIS — E871 Hypo-osmolality and hyponatremia: Secondary | ICD-10-CM | POA: Diagnosis not present

## 2018-12-16 DIAGNOSIS — M1991 Primary osteoarthritis, unspecified site: Secondary | ICD-10-CM | POA: Diagnosis not present

## 2018-12-16 DIAGNOSIS — D509 Iron deficiency anemia, unspecified: Secondary | ICD-10-CM | POA: Diagnosis not present

## 2018-12-16 DIAGNOSIS — I10 Essential (primary) hypertension: Secondary | ICD-10-CM | POA: Diagnosis not present

## 2018-12-16 DIAGNOSIS — J441 Chronic obstructive pulmonary disease with (acute) exacerbation: Secondary | ICD-10-CM | POA: Diagnosis not present

## 2018-12-16 DIAGNOSIS — G9009 Other idiopathic peripheral autonomic neuropathy: Secondary | ICD-10-CM | POA: Diagnosis not present

## 2018-12-16 DIAGNOSIS — E871 Hypo-osmolality and hyponatremia: Secondary | ICD-10-CM | POA: Diagnosis not present

## 2018-12-17 DIAGNOSIS — E871 Hypo-osmolality and hyponatremia: Secondary | ICD-10-CM | POA: Diagnosis not present

## 2018-12-17 DIAGNOSIS — I10 Essential (primary) hypertension: Secondary | ICD-10-CM | POA: Diagnosis not present

## 2018-12-17 DIAGNOSIS — J441 Chronic obstructive pulmonary disease with (acute) exacerbation: Secondary | ICD-10-CM | POA: Diagnosis not present

## 2018-12-17 DIAGNOSIS — D509 Iron deficiency anemia, unspecified: Secondary | ICD-10-CM | POA: Diagnosis not present

## 2018-12-17 DIAGNOSIS — M1991 Primary osteoarthritis, unspecified site: Secondary | ICD-10-CM | POA: Diagnosis not present

## 2018-12-17 DIAGNOSIS — G9009 Other idiopathic peripheral autonomic neuropathy: Secondary | ICD-10-CM | POA: Diagnosis not present

## 2018-12-25 DIAGNOSIS — J441 Chronic obstructive pulmonary disease with (acute) exacerbation: Secondary | ICD-10-CM | POA: Diagnosis not present

## 2018-12-25 DIAGNOSIS — M1991 Primary osteoarthritis, unspecified site: Secondary | ICD-10-CM | POA: Diagnosis not present

## 2018-12-25 DIAGNOSIS — I10 Essential (primary) hypertension: Secondary | ICD-10-CM | POA: Diagnosis not present

## 2018-12-25 DIAGNOSIS — E871 Hypo-osmolality and hyponatremia: Secondary | ICD-10-CM | POA: Diagnosis not present

## 2018-12-25 DIAGNOSIS — G9009 Other idiopathic peripheral autonomic neuropathy: Secondary | ICD-10-CM | POA: Diagnosis not present

## 2018-12-25 DIAGNOSIS — D509 Iron deficiency anemia, unspecified: Secondary | ICD-10-CM | POA: Diagnosis not present

## 2018-12-26 DIAGNOSIS — I10 Essential (primary) hypertension: Secondary | ICD-10-CM | POA: Diagnosis not present

## 2018-12-26 DIAGNOSIS — E871 Hypo-osmolality and hyponatremia: Secondary | ICD-10-CM | POA: Diagnosis not present

## 2018-12-26 DIAGNOSIS — G9009 Other idiopathic peripheral autonomic neuropathy: Secondary | ICD-10-CM | POA: Diagnosis not present

## 2018-12-26 DIAGNOSIS — D509 Iron deficiency anemia, unspecified: Secondary | ICD-10-CM | POA: Diagnosis not present

## 2018-12-26 DIAGNOSIS — D649 Anemia, unspecified: Secondary | ICD-10-CM | POA: Diagnosis not present

## 2018-12-26 DIAGNOSIS — J441 Chronic obstructive pulmonary disease with (acute) exacerbation: Secondary | ICD-10-CM | POA: Diagnosis not present

## 2018-12-26 DIAGNOSIS — M7731 Calcaneal spur, right foot: Secondary | ICD-10-CM | POA: Diagnosis not present

## 2018-12-26 DIAGNOSIS — M1991 Primary osteoarthritis, unspecified site: Secondary | ICD-10-CM | POA: Diagnosis not present

## 2018-12-29 DIAGNOSIS — J441 Chronic obstructive pulmonary disease with (acute) exacerbation: Secondary | ICD-10-CM | POA: Diagnosis not present

## 2018-12-29 DIAGNOSIS — M1991 Primary osteoarthritis, unspecified site: Secondary | ICD-10-CM | POA: Diagnosis not present

## 2018-12-29 DIAGNOSIS — M79672 Pain in left foot: Secondary | ICD-10-CM | POA: Diagnosis not present

## 2018-12-29 DIAGNOSIS — G9009 Other idiopathic peripheral autonomic neuropathy: Secondary | ICD-10-CM | POA: Diagnosis not present

## 2018-12-29 DIAGNOSIS — D509 Iron deficiency anemia, unspecified: Secondary | ICD-10-CM | POA: Diagnosis not present

## 2018-12-29 DIAGNOSIS — I10 Essential (primary) hypertension: Secondary | ICD-10-CM | POA: Diagnosis not present

## 2018-12-29 DIAGNOSIS — E871 Hypo-osmolality and hyponatremia: Secondary | ICD-10-CM | POA: Diagnosis not present

## 2019-01-01 DIAGNOSIS — M79672 Pain in left foot: Secondary | ICD-10-CM | POA: Diagnosis not present

## 2019-01-05 DIAGNOSIS — D509 Iron deficiency anemia, unspecified: Secondary | ICD-10-CM | POA: Diagnosis not present

## 2019-01-05 DIAGNOSIS — M1991 Primary osteoarthritis, unspecified site: Secondary | ICD-10-CM | POA: Diagnosis not present

## 2019-01-05 DIAGNOSIS — G9009 Other idiopathic peripheral autonomic neuropathy: Secondary | ICD-10-CM | POA: Diagnosis not present

## 2019-01-05 DIAGNOSIS — J441 Chronic obstructive pulmonary disease with (acute) exacerbation: Secondary | ICD-10-CM | POA: Diagnosis not present

## 2019-01-05 DIAGNOSIS — I10 Essential (primary) hypertension: Secondary | ICD-10-CM | POA: Diagnosis not present

## 2019-01-05 DIAGNOSIS — E871 Hypo-osmolality and hyponatremia: Secondary | ICD-10-CM | POA: Diagnosis not present

## 2019-01-06 DIAGNOSIS — I10 Essential (primary) hypertension: Secondary | ICD-10-CM | POA: Diagnosis not present

## 2019-01-06 DIAGNOSIS — J441 Chronic obstructive pulmonary disease with (acute) exacerbation: Secondary | ICD-10-CM | POA: Diagnosis not present

## 2019-01-06 DIAGNOSIS — G9009 Other idiopathic peripheral autonomic neuropathy: Secondary | ICD-10-CM | POA: Diagnosis not present

## 2019-01-06 DIAGNOSIS — D509 Iron deficiency anemia, unspecified: Secondary | ICD-10-CM | POA: Diagnosis not present

## 2019-01-06 DIAGNOSIS — E871 Hypo-osmolality and hyponatremia: Secondary | ICD-10-CM | POA: Diagnosis not present

## 2019-01-06 DIAGNOSIS — M1991 Primary osteoarthritis, unspecified site: Secondary | ICD-10-CM | POA: Diagnosis not present

## 2019-01-13 DIAGNOSIS — E871 Hypo-osmolality and hyponatremia: Secondary | ICD-10-CM | POA: Diagnosis not present

## 2019-01-13 DIAGNOSIS — M1991 Primary osteoarthritis, unspecified site: Secondary | ICD-10-CM | POA: Diagnosis not present

## 2019-01-13 DIAGNOSIS — J441 Chronic obstructive pulmonary disease with (acute) exacerbation: Secondary | ICD-10-CM | POA: Diagnosis not present

## 2019-01-13 DIAGNOSIS — G9009 Other idiopathic peripheral autonomic neuropathy: Secondary | ICD-10-CM | POA: Diagnosis not present

## 2019-01-13 DIAGNOSIS — D509 Iron deficiency anemia, unspecified: Secondary | ICD-10-CM | POA: Diagnosis not present

## 2019-01-13 DIAGNOSIS — I10 Essential (primary) hypertension: Secondary | ICD-10-CM | POA: Diagnosis not present

## 2019-01-14 DIAGNOSIS — J441 Chronic obstructive pulmonary disease with (acute) exacerbation: Secondary | ICD-10-CM | POA: Diagnosis not present

## 2019-01-14 DIAGNOSIS — I10 Essential (primary) hypertension: Secondary | ICD-10-CM | POA: Diagnosis not present

## 2019-01-14 DIAGNOSIS — G9009 Other idiopathic peripheral autonomic neuropathy: Secondary | ICD-10-CM | POA: Diagnosis not present

## 2019-01-14 DIAGNOSIS — E871 Hypo-osmolality and hyponatremia: Secondary | ICD-10-CM | POA: Diagnosis not present

## 2019-01-14 DIAGNOSIS — D509 Iron deficiency anemia, unspecified: Secondary | ICD-10-CM | POA: Diagnosis not present

## 2019-01-14 DIAGNOSIS — M1991 Primary osteoarthritis, unspecified site: Secondary | ICD-10-CM | POA: Diagnosis not present

## 2019-01-15 DIAGNOSIS — J441 Chronic obstructive pulmonary disease with (acute) exacerbation: Secondary | ICD-10-CM | POA: Diagnosis not present

## 2019-01-15 DIAGNOSIS — G9009 Other idiopathic peripheral autonomic neuropathy: Secondary | ICD-10-CM | POA: Diagnosis not present

## 2019-01-15 DIAGNOSIS — Z8701 Personal history of pneumonia (recurrent): Secondary | ICD-10-CM | POA: Diagnosis not present

## 2019-01-15 DIAGNOSIS — E0590 Thyrotoxicosis, unspecified without thyrotoxic crisis or storm: Secondary | ICD-10-CM | POA: Diagnosis not present

## 2019-01-15 DIAGNOSIS — Z933 Colostomy status: Secondary | ICD-10-CM | POA: Diagnosis not present

## 2019-01-15 DIAGNOSIS — E871 Hypo-osmolality and hyponatremia: Secondary | ICD-10-CM | POA: Diagnosis not present

## 2019-01-15 DIAGNOSIS — R1312 Dysphagia, oropharyngeal phase: Secondary | ICD-10-CM | POA: Diagnosis not present

## 2019-01-15 DIAGNOSIS — Z8673 Personal history of transient ischemic attack (TIA), and cerebral infarction without residual deficits: Secondary | ICD-10-CM | POA: Diagnosis not present

## 2019-01-15 DIAGNOSIS — F341 Dysthymic disorder: Secondary | ICD-10-CM | POA: Diagnosis not present

## 2019-01-15 DIAGNOSIS — Z87440 Personal history of urinary (tract) infections: Secondary | ICD-10-CM | POA: Diagnosis not present

## 2019-01-15 DIAGNOSIS — K219 Gastro-esophageal reflux disease without esophagitis: Secondary | ICD-10-CM | POA: Diagnosis not present

## 2019-01-15 DIAGNOSIS — G894 Chronic pain syndrome: Secondary | ICD-10-CM | POA: Diagnosis not present

## 2019-01-15 DIAGNOSIS — M1991 Primary osteoarthritis, unspecified site: Secondary | ICD-10-CM | POA: Diagnosis not present

## 2019-01-15 DIAGNOSIS — D509 Iron deficiency anemia, unspecified: Secondary | ICD-10-CM | POA: Diagnosis not present

## 2019-01-15 DIAGNOSIS — Z9981 Dependence on supplemental oxygen: Secondary | ICD-10-CM | POA: Diagnosis not present

## 2019-01-15 DIAGNOSIS — M542 Cervicalgia: Secondary | ICD-10-CM | POA: Diagnosis not present

## 2019-01-15 DIAGNOSIS — Z7951 Long term (current) use of inhaled steroids: Secondary | ICD-10-CM | POA: Diagnosis not present

## 2019-01-15 DIAGNOSIS — Z9181 History of falling: Secondary | ICD-10-CM | POA: Diagnosis not present

## 2019-01-15 DIAGNOSIS — I10 Essential (primary) hypertension: Secondary | ICD-10-CM | POA: Diagnosis not present

## 2019-01-15 DIAGNOSIS — F411 Generalized anxiety disorder: Secondary | ICD-10-CM | POA: Diagnosis not present

## 2019-01-21 DIAGNOSIS — J441 Chronic obstructive pulmonary disease with (acute) exacerbation: Secondary | ICD-10-CM | POA: Diagnosis not present

## 2019-01-21 DIAGNOSIS — M1991 Primary osteoarthritis, unspecified site: Secondary | ICD-10-CM | POA: Diagnosis not present

## 2019-01-21 DIAGNOSIS — I10 Essential (primary) hypertension: Secondary | ICD-10-CM | POA: Diagnosis not present

## 2019-01-21 DIAGNOSIS — M542 Cervicalgia: Secondary | ICD-10-CM | POA: Diagnosis not present

## 2019-01-21 DIAGNOSIS — G894 Chronic pain syndrome: Secondary | ICD-10-CM | POA: Diagnosis not present

## 2019-01-21 DIAGNOSIS — G9009 Other idiopathic peripheral autonomic neuropathy: Secondary | ICD-10-CM | POA: Diagnosis not present

## 2019-01-26 DIAGNOSIS — J441 Chronic obstructive pulmonary disease with (acute) exacerbation: Secondary | ICD-10-CM | POA: Diagnosis not present

## 2019-01-26 DIAGNOSIS — G894 Chronic pain syndrome: Secondary | ICD-10-CM | POA: Diagnosis not present

## 2019-01-26 DIAGNOSIS — I10 Essential (primary) hypertension: Secondary | ICD-10-CM | POA: Diagnosis not present

## 2019-01-26 DIAGNOSIS — M1991 Primary osteoarthritis, unspecified site: Secondary | ICD-10-CM | POA: Diagnosis not present

## 2019-01-26 DIAGNOSIS — M542 Cervicalgia: Secondary | ICD-10-CM | POA: Diagnosis not present

## 2019-01-26 DIAGNOSIS — G9009 Other idiopathic peripheral autonomic neuropathy: Secondary | ICD-10-CM | POA: Diagnosis not present

## 2019-02-04 DIAGNOSIS — I10 Essential (primary) hypertension: Secondary | ICD-10-CM | POA: Diagnosis not present

## 2019-02-04 DIAGNOSIS — M1991 Primary osteoarthritis, unspecified site: Secondary | ICD-10-CM | POA: Diagnosis not present

## 2019-02-04 DIAGNOSIS — G894 Chronic pain syndrome: Secondary | ICD-10-CM | POA: Diagnosis not present

## 2019-02-04 DIAGNOSIS — J441 Chronic obstructive pulmonary disease with (acute) exacerbation: Secondary | ICD-10-CM | POA: Diagnosis not present

## 2019-02-04 DIAGNOSIS — M542 Cervicalgia: Secondary | ICD-10-CM | POA: Diagnosis not present

## 2019-02-04 DIAGNOSIS — G9009 Other idiopathic peripheral autonomic neuropathy: Secondary | ICD-10-CM | POA: Diagnosis not present

## 2019-02-11 DIAGNOSIS — G9009 Other idiopathic peripheral autonomic neuropathy: Secondary | ICD-10-CM | POA: Diagnosis not present

## 2019-02-11 DIAGNOSIS — G894 Chronic pain syndrome: Secondary | ICD-10-CM | POA: Diagnosis not present

## 2019-02-11 DIAGNOSIS — M542 Cervicalgia: Secondary | ICD-10-CM | POA: Diagnosis not present

## 2019-02-11 DIAGNOSIS — M1991 Primary osteoarthritis, unspecified site: Secondary | ICD-10-CM | POA: Diagnosis not present

## 2019-02-11 DIAGNOSIS — I10 Essential (primary) hypertension: Secondary | ICD-10-CM | POA: Diagnosis not present

## 2019-02-11 DIAGNOSIS — J441 Chronic obstructive pulmonary disease with (acute) exacerbation: Secondary | ICD-10-CM | POA: Diagnosis not present

## 2019-02-14 DIAGNOSIS — M542 Cervicalgia: Secondary | ICD-10-CM | POA: Diagnosis not present

## 2019-02-14 DIAGNOSIS — Z9981 Dependence on supplemental oxygen: Secondary | ICD-10-CM | POA: Diagnosis not present

## 2019-02-14 DIAGNOSIS — D509 Iron deficiency anemia, unspecified: Secondary | ICD-10-CM | POA: Diagnosis not present

## 2019-02-14 DIAGNOSIS — R1312 Dysphagia, oropharyngeal phase: Secondary | ICD-10-CM | POA: Diagnosis not present

## 2019-02-14 DIAGNOSIS — Z8701 Personal history of pneumonia (recurrent): Secondary | ICD-10-CM | POA: Diagnosis not present

## 2019-02-14 DIAGNOSIS — K219 Gastro-esophageal reflux disease without esophagitis: Secondary | ICD-10-CM | POA: Diagnosis not present

## 2019-02-14 DIAGNOSIS — G894 Chronic pain syndrome: Secondary | ICD-10-CM | POA: Diagnosis not present

## 2019-02-14 DIAGNOSIS — Z933 Colostomy status: Secondary | ICD-10-CM | POA: Diagnosis not present

## 2019-02-14 DIAGNOSIS — I10 Essential (primary) hypertension: Secondary | ICD-10-CM | POA: Diagnosis not present

## 2019-02-14 DIAGNOSIS — E871 Hypo-osmolality and hyponatremia: Secondary | ICD-10-CM | POA: Diagnosis not present

## 2019-02-14 DIAGNOSIS — E0590 Thyrotoxicosis, unspecified without thyrotoxic crisis or storm: Secondary | ICD-10-CM | POA: Diagnosis not present

## 2019-02-14 DIAGNOSIS — Z8673 Personal history of transient ischemic attack (TIA), and cerebral infarction without residual deficits: Secondary | ICD-10-CM | POA: Diagnosis not present

## 2019-02-14 DIAGNOSIS — F411 Generalized anxiety disorder: Secondary | ICD-10-CM | POA: Diagnosis not present

## 2019-02-14 DIAGNOSIS — M1991 Primary osteoarthritis, unspecified site: Secondary | ICD-10-CM | POA: Diagnosis not present

## 2019-02-14 DIAGNOSIS — G9009 Other idiopathic peripheral autonomic neuropathy: Secondary | ICD-10-CM | POA: Diagnosis not present

## 2019-02-14 DIAGNOSIS — J441 Chronic obstructive pulmonary disease with (acute) exacerbation: Secondary | ICD-10-CM | POA: Diagnosis not present

## 2019-02-14 DIAGNOSIS — Z9181 History of falling: Secondary | ICD-10-CM | POA: Diagnosis not present

## 2019-02-14 DIAGNOSIS — Z7951 Long term (current) use of inhaled steroids: Secondary | ICD-10-CM | POA: Diagnosis not present

## 2019-02-14 DIAGNOSIS — F341 Dysthymic disorder: Secondary | ICD-10-CM | POA: Diagnosis not present

## 2019-02-14 DIAGNOSIS — Z87440 Personal history of urinary (tract) infections: Secondary | ICD-10-CM | POA: Diagnosis not present

## 2019-02-24 DIAGNOSIS — M1991 Primary osteoarthritis, unspecified site: Secondary | ICD-10-CM | POA: Diagnosis not present

## 2019-02-24 DIAGNOSIS — M542 Cervicalgia: Secondary | ICD-10-CM | POA: Diagnosis not present

## 2019-02-24 DIAGNOSIS — J441 Chronic obstructive pulmonary disease with (acute) exacerbation: Secondary | ICD-10-CM | POA: Diagnosis not present

## 2019-02-24 DIAGNOSIS — G9009 Other idiopathic peripheral autonomic neuropathy: Secondary | ICD-10-CM | POA: Diagnosis not present

## 2019-02-24 DIAGNOSIS — G894 Chronic pain syndrome: Secondary | ICD-10-CM | POA: Diagnosis not present

## 2019-02-24 DIAGNOSIS — I10 Essential (primary) hypertension: Secondary | ICD-10-CM | POA: Diagnosis not present

## 2019-03-10 DIAGNOSIS — M542 Cervicalgia: Secondary | ICD-10-CM | POA: Diagnosis not present

## 2019-03-10 DIAGNOSIS — G894 Chronic pain syndrome: Secondary | ICD-10-CM | POA: Diagnosis not present

## 2019-03-10 DIAGNOSIS — J441 Chronic obstructive pulmonary disease with (acute) exacerbation: Secondary | ICD-10-CM | POA: Diagnosis not present

## 2019-03-10 DIAGNOSIS — M1991 Primary osteoarthritis, unspecified site: Secondary | ICD-10-CM | POA: Diagnosis not present

## 2019-03-10 DIAGNOSIS — G9009 Other idiopathic peripheral autonomic neuropathy: Secondary | ICD-10-CM | POA: Diagnosis not present

## 2019-03-10 DIAGNOSIS — I10 Essential (primary) hypertension: Secondary | ICD-10-CM | POA: Diagnosis not present

## 2019-03-12 DIAGNOSIS — I10 Essential (primary) hypertension: Secondary | ICD-10-CM | POA: Diagnosis not present

## 2019-03-12 DIAGNOSIS — G9009 Other idiopathic peripheral autonomic neuropathy: Secondary | ICD-10-CM | POA: Diagnosis not present

## 2019-03-12 DIAGNOSIS — J441 Chronic obstructive pulmonary disease with (acute) exacerbation: Secondary | ICD-10-CM | POA: Diagnosis not present

## 2019-03-12 DIAGNOSIS — M542 Cervicalgia: Secondary | ICD-10-CM | POA: Diagnosis not present

## 2019-03-12 DIAGNOSIS — G894 Chronic pain syndrome: Secondary | ICD-10-CM | POA: Diagnosis not present

## 2019-03-12 DIAGNOSIS — M1991 Primary osteoarthritis, unspecified site: Secondary | ICD-10-CM | POA: Diagnosis not present

## 2020-01-01 DEATH — deceased
